# Patient Record
Sex: Female | Born: 1980 | Race: White | Hispanic: No | Marital: Married | Smoking: Never smoker
Health system: Southern US, Community
[De-identification: ages and names within clinical notes are randomized; demographics above are authoritative.]

## PROBLEM LIST (undated history)

## (undated) DIAGNOSIS — B2790 Infectious mononucleosis, unspecified without complication: Secondary | ICD-10-CM

## (undated) DIAGNOSIS — R739 Hyperglycemia, unspecified: Secondary | ICD-10-CM

## (undated) DIAGNOSIS — D649 Anemia, unspecified: Secondary | ICD-10-CM

## (undated) DIAGNOSIS — I7300 Raynaud's syndrome without gangrene: Secondary | ICD-10-CM

## (undated) DIAGNOSIS — E039 Hypothyroidism, unspecified: Secondary | ICD-10-CM

## (undated) DIAGNOSIS — R112 Nausea with vomiting, unspecified: Secondary | ICD-10-CM

## (undated) DIAGNOSIS — Z87442 Personal history of urinary calculi: Secondary | ICD-10-CM

## (undated) DIAGNOSIS — R748 Abnormal levels of other serum enzymes: Secondary | ICD-10-CM

## (undated) DIAGNOSIS — Z9889 Other specified postprocedural states: Secondary | ICD-10-CM

## (undated) HISTORY — PX: ABDOMINAL HYSTERECTOMY: SHX81

## (undated) HISTORY — PX: TOE SURGERY: SHX1073

## (undated) HISTORY — PX: TUBAL LIGATION: SHX77

## (undated) HISTORY — PX: NO PAST SURGERIES: SHX2092

---

## 2012-07-29 ENCOUNTER — Other Ambulatory Visit (HOSPITAL_COMMUNITY): Payer: Self-pay | Admitting: Cardiology

## 2012-07-29 DIAGNOSIS — R002 Palpitations: Secondary | ICD-10-CM

## 2012-08-16 ENCOUNTER — Ambulatory Visit (HOSPITAL_COMMUNITY)
Admission: RE | Admit: 2012-08-16 | Discharge: 2012-08-16 | Disposition: A | Discharge status: Home / Self Care | Payer: 59 | Source: Ambulatory Visit | Attending: Internal Medicine | Admitting: Internal Medicine

## 2012-08-16 DIAGNOSIS — R002 Palpitations: Secondary | ICD-10-CM

## 2012-08-16 NOTE — Progress Notes (Signed)
[facility]   2D echo completed 08/16/2012.   Alexandra Strickland, RDCS

## 2014-08-18 ENCOUNTER — Observation Stay (HOSPITAL_COMMUNITY)
Admission: EM | Admit: 2014-08-18 | Discharge: 2014-08-20 | Disposition: A | Discharge status: Home / Self Care | Payer: BLUE CROSS/BLUE SHIELD | Attending: Internal Medicine | Admitting: Internal Medicine

## 2014-08-18 ENCOUNTER — Emergency Department (HOSPITAL_COMMUNITY): Payer: BLUE CROSS/BLUE SHIELD

## 2014-08-18 ENCOUNTER — Encounter (HOSPITAL_COMMUNITY): Payer: Self-pay

## 2014-08-18 DIAGNOSIS — Z882 Allergy status to sulfonamides status: Secondary | ICD-10-CM | POA: Diagnosis not present

## 2014-08-18 DIAGNOSIS — Q8740 Marfan's syndrome, unspecified: Secondary | ICD-10-CM

## 2014-08-18 DIAGNOSIS — F1099 Alcohol use, unspecified with unspecified alcohol-induced disorder: Secondary | ICD-10-CM | POA: Insufficient documentation

## 2014-08-18 DIAGNOSIS — R7989 Other specified abnormal findings of blood chemistry: Secondary | ICD-10-CM | POA: Diagnosis present

## 2014-08-18 DIAGNOSIS — R55 Syncope and collapse: Principal | ICD-10-CM | POA: Diagnosis present

## 2014-08-18 DIAGNOSIS — E870 Hyperosmolality and hypernatremia: Secondary | ICD-10-CM | POA: Insufficient documentation

## 2014-08-18 DIAGNOSIS — Z888 Allergy status to other drugs, medicaments and biological substances status: Secondary | ICD-10-CM | POA: Diagnosis not present

## 2014-08-18 DIAGNOSIS — N179 Acute kidney failure, unspecified: Secondary | ICD-10-CM | POA: Diagnosis not present

## 2014-08-18 DIAGNOSIS — E785 Hyperlipidemia, unspecified: Secondary | ICD-10-CM | POA: Diagnosis not present

## 2014-08-18 DIAGNOSIS — Z9851 Tubal ligation status: Secondary | ICD-10-CM | POA: Diagnosis not present

## 2014-08-18 DIAGNOSIS — R945 Abnormal results of liver function studies: Secondary | ICD-10-CM

## 2014-08-18 HISTORY — DX: Hyperglycemia, unspecified: R73.9

## 2014-08-18 LAB — CBC WITH DIFFERENTIAL/PLATELET
BASOS ABS: 0 10*3/uL (ref 0.0–0.1)
BASOS PCT: 1 % (ref 0–1)
Eosinophils Absolute: 0.1 10*3/uL (ref 0.0–0.7)
Eosinophils Relative: 2 % (ref 0–5)
HEMATOCRIT: 37.7 % (ref 36.0–46.0)
HEMOGLOBIN: 12.6 g/dL (ref 12.0–15.0)
LYMPHS ABS: 1.1 10*3/uL (ref 0.7–4.0)
Lymphocytes Relative: 24 % (ref 12–46)
MCH: 30.4 pg (ref 26.0–34.0)
MCHC: 33.4 g/dL (ref 30.0–36.0)
MCV: 90.8 fL (ref 78.0–100.0)
MONO ABS: 0.2 10*3/uL (ref 0.1–1.0)
MONOS PCT: 3 % (ref 3–12)
Neutro Abs: 3.2 10*3/uL (ref 1.7–7.7)
Neutrophils Relative %: 70 % (ref 43–77)
PLATELETS: 159 10*3/uL (ref 150–400)
RBC: 4.15 MIL/uL (ref 3.87–5.11)
RDW: 15.6 % — ABNORMAL HIGH (ref 11.5–15.5)
WBC: 4.5 10*3/uL (ref 4.0–10.5)

## 2014-08-18 LAB — URINALYSIS, ROUTINE W REFLEX MICROSCOPIC
Bilirubin Urine: NEGATIVE
GLUCOSE, UA: NEGATIVE mg/dL
Hgb urine dipstick: NEGATIVE
Ketones, ur: NEGATIVE mg/dL
Leukocytes, UA: NEGATIVE
Nitrite: NEGATIVE
PH: 6 (ref 5.0–8.0)
PROTEIN: NEGATIVE mg/dL
SPECIFIC GRAVITY, URINE: 1.012 (ref 1.005–1.030)
Urobilinogen, UA: 0.2 mg/dL (ref 0.0–1.0)

## 2014-08-18 LAB — I-STAT CHEM 8, ED
BUN: 16 mg/dL (ref 6–23)
CALCIUM ION: 1.11 mmol/L — AB (ref 1.12–1.23)
CREATININE: 1.2 mg/dL — AB (ref 0.50–1.10)
Chloride: 111 mmol/L (ref 96–112)
Glucose, Bld: 90 mg/dL (ref 70–99)
HCT: 39 % (ref 36.0–46.0)
Hemoglobin: 13.3 g/dL (ref 12.0–15.0)
POTASSIUM: 4 mmol/L (ref 3.5–5.1)
SODIUM: 146 mmol/L — AB (ref 135–145)
TCO2: 18 mmol/L (ref 0–100)

## 2014-08-18 LAB — POCT PREGNANCY, URINE: PREG TEST UR: NEGATIVE

## 2014-08-18 LAB — CK: CK TOTAL: 278 U/L — AB (ref 7–177)

## 2014-08-18 MED ORDER — SODIUM CHLORIDE 0.9 % IV BOLUS (SEPSIS)
2000.0000 mL | Freq: Once | INTRAVENOUS | Status: AC
Start: 2014-08-18 — End: 2014-08-18
  Administered 2014-08-18: 2000 mL via INTRAVENOUS

## 2014-08-18 MED ORDER — SODIUM CHLORIDE 0.45 % IV SOLN
INTRAVENOUS | Status: DC
  Administered 2014-08-18: 22:00:00 via INTRAVENOUS

## 2014-08-18 MED ORDER — ACETAMINOPHEN 500 MG PO TABS
1000.0000 mg | ORAL_TABLET | Freq: Four times a day (QID) | ORAL | Status: DC | PRN
  Administered 2014-08-18: 1000 mg via ORAL
  Filled 2014-08-18: qty 2

## 2014-08-18 MED ORDER — ONDANSETRON HCL 4 MG/2ML IJ SOLN
4.0000 mg | Freq: Once | INTRAMUSCULAR | Status: AC
  Administered 2014-08-18: 4 mg via INTRAVENOUS
  Filled 2014-08-18: qty 2

## 2014-08-18 MED ORDER — METOCLOPRAMIDE HCL 5 MG/ML IJ SOLN
5.0000 mg | Freq: Once | INTRAMUSCULAR | Status: DC
  Filled 2014-08-18: qty 1

## 2014-08-18 MED ORDER — ENOXAPARIN SODIUM 40 MG/0.4ML ~~LOC~~ SOLN
40.0000 mg | Freq: Every day | SUBCUTANEOUS | Status: DC
  Administered 2014-08-18: 40 mg via SUBCUTANEOUS
  Filled 2014-08-18 (×2): qty 0.4

## 2014-08-18 MED ORDER — ONDANSETRON HCL 4 MG/2ML IJ SOLN
4.0000 mg | Freq: Four times a day (QID) | INTRAMUSCULAR | Status: DC | PRN
  Administered 2014-08-18: 4 mg via INTRAVENOUS
  Filled 2014-08-18: qty 2

## 2014-08-18 MED ORDER — SODIUM CHLORIDE 0.9 % IJ SOLN
3.0000 mL | Freq: Two times a day (BID) | INTRAMUSCULAR | Status: DC
  Administered 2014-08-19: 3 mL via INTRAVENOUS

## 2014-08-18 NOTE — ED Provider Notes (Signed)
CSN: [id number]     Arrival date & time 08/18/14  1630 History   First MD Initiated Contact with Patient 08/18/14 1635     Chief Complaint  Patient presents with  . Loss of Consciousness    Level V caveat altered mental status. History is obtained from patient, from patient's husband and from paramedics (Consider location/radiation/quality/duration/timing/severity/associated sxs/prior Treatment) HPI Patient was running in a 10K race earlier today when she collapsed while running and was confused, and combative immediately after the event.. Presently she denies pain anywhere states "I feel sad". EMS obtained cbg which was 163 and treated patient with intravenous fluids.  No past medical history on file. No past surgical history on file. past medical history negative No family history on file. History  Substance Use Topics  . Smoking status: Not on file  . Smokeless tobacco: Not on file  . Alcohol Use: Not on file   no tobacco no alcohol no drug OB History    No data available     Review of Systems  Unable to perform ROS  unable to obtain review of systems. Altered mental status    Allergies  Review of patient's allergies indicates not on file.  Home Medications   Prior to Admission medications   Not on File   BP 101/72 mmHg  Pulse 108  Temp(Src) 100.7 F (38.2 C) (Rectal)  Resp 17  SpO2 98% Physical Exam  Constitutional: She appears well-developed and well-nourished. She appears distressed.  HENT:  Head: Normocephalic and atraumatic.  Eyes: Conjunctivae are normal. Pupils are equal, round, and reactive to light.  Neck: Neck supple. No JVD present. No tracheal deviation present. No thyromegaly present.  Cardiovascular: Regular rhythm.   No murmur heard. Mildly tachycardic  Pulmonary/Chest: Effort normal and breath sounds normal.  Abdominal: Soft. Bowel sounds are normal. She exhibits no distension. There is no tenderness.  Musculoskeletal: Normal range of motion.  She exhibits no edema or tenderness.  Neurological: She is alert. No cranial nerve deficit. Coordination normal.  Moves all extremities follows simple commands. DTRs symmetric bilaterally at knee jerk ankle jerk and biceps toes downward going bilaterally. Does not answer all questions.  Skin: No rash noted.  grossly diaphoretic  Psychiatric: She has a normal mood and affect.  Nursing note and vitals reviewed.   ED Course  Procedures (including critical care time) Labs Review Labs Reviewed  CBC WITH DIFFERENTIAL/PLATELET  CK  URINALYSIS, ROUTINE W REFLEX MICROSCOPIC  I-STAT CHEM 8, ED  POC URINE PREG, ED    Imaging Review No results found.   EKG Interpretation   Date/Time:  Saturday August 18 2014 16:51:53 EDT Ventricular Rate:  99 PR Interval:  151 QRS Duration: 87 QT Interval:  365 QTC Calculation: 468 R Axis:   69 Text Interpretation:  Sinus rhythm No significant change since last  tracing Confirmed by Saket Hellstrom  MD, Jann Ra [phone]) on 08/18/2014 4:55:14 PM     7 PM patient feels improved however she feels continues to feel nauseated after treatment with intravenous fluids and intravenous Zofran. Additional Zofran ordered. She is alert appropriate Glasgow Coma Score 15.  Have an 50 5 PM patient is alert Glasgow Coma Score 15 continues to complain of generalized weakness. Nausea is improved after treatment with antiemetics. Results for orders placed or performed during the hospital encounter of 08/18/14  CBC with Differential/Platelet  Result Value Ref Range   WBC 4.5 4.0 - 10.5 K/uL   RBC 4.15 3.87 - 5.11 MIL/uL   Hemoglobin  12.6 12.0 - 15.0 g/dL   HCT 16.1 09.6 - 04.5 %   MCV 90.8 78.0 - 100.0 fL   MCH 30.4 26.0 - 34.0 pg   MCHC 33.4 30.0 - 36.0 g/dL   RDW 40.9 (H) 81.1 - 91.4 %   Platelets 159 150 - 400 K/uL   Neutrophils Relative % 70 43 - 77 %   Neutro Abs 3.2 1.7 - 7.7 K/uL   Lymphocytes Relative 24 12 - 46 %   Lymphs Abs 1.1 0.7 - 4.0 K/uL   Monocytes  Relative 3 3 - 12 %   Monocytes Absolute 0.2 0.1 - 1.0 K/uL   Eosinophils Relative 2 0 - 5 %   Eosinophils Absolute 0.1 0.0 - 0.7 K/uL   Basophils Relative 1 0 - 1 %   Basophils Absolute 0.0 0.0 - 0.1 K/uL  CK  Result Value Ref Range   Total CK 278 (H) 7 - 177 U/L  I-stat chem 8, ed  Result Value Ref Range   Sodium 146 (H) 135 - 145 mmol/L   Potassium 4.0 3.5 - 5.1 mmol/L   Chloride 111 96 - 112 mmol/L   BUN 16 6 - 23 mg/dL   Creatinine, Ser 7.82 (H) 0.50 - 1.10 mg/dL   Glucose, Bld 90 70 - 99 mg/dL   Calcium, Ion 9.56 (L) 1.12 - 1.23 mmol/L   TCO2 18 0 - 100 mmol/L   Hemoglobin 13.3 12.0 - 15.0 g/dL   HCT 21.3 08.6 - 57.8 %   Ct Head Wo Contrast  08/18/2014   CLINICAL DATA:  Syncope.  Nausea.  Headache.  Altered mental status.  EXAM: CT HEAD WITHOUT CONTRAST  TECHNIQUE: Contiguous axial images were obtained from the base of the skull through the vertex without intravenous contrast.  COMPARISON:  None.  FINDINGS: No evidence of intracranial hemorrhage, brain edema, or other signs of acute infarction. No evidence of intracranial mass lesion or mass effect.  No abnormal extraaxial fluid collections identified. Ventricles are normal in size. No skull abnormality identified.  IMPRESSION: Negative noncontrast head CT.   Electronically Signed   By: Myles Rosenthal M.D.   On: 08/18/2014 18:26    MDM  Suspect mild heat stroke given elevated temperature, confusion, syncope. Symptoms improving with intravenous fluids and antiemetics. Final diagnoses:  None   Spoke with Dr. Selena Batten Plan telemetry, observation, Dx #1 exertional syncope #2 altered mental status     Doug Sou, MD 08/18/14 1956

## 2014-08-18 NOTE — ED Notes (Signed)
Pt given PO fluids with verbal order from EDP.

## 2014-08-18 NOTE — ED Notes (Signed)
Transporting patient to new room assignment. 

## 2014-08-18 NOTE — ED Notes (Signed)
EDP at bedside  

## 2014-08-18 NOTE — ED Notes (Addendum)
[facility]- pt coming from a 10K, was found after syncopal episode on the side of the road. On EMS arrival, pt noted to have HR of 160, JVD noted, unresponsive. CBG of 161, 216g PIV in place. Pt received 1500cc en route.

## 2014-08-18 NOTE — H&P (Signed)
Alexandra CossKristen Strickland is an 34 y.o. female.    Chief Complaint: syncope HPI: 34 yo female was running in a race and doesn't recall what happened.  There is question of syncope about 5.5 miles into the race. Pt is not sure how long she was out.  No seizure.  Pt denies cp, palp, sob.   + n/v.  No bloody emesis.  Pt denies focal weakness, numbness, tingling.  There was some element of confusion.  Per family  sbp 80.  Pt will be admitted for syncope.   Past Medical History  Diagnosis Date  . Hyperlipemia   . Hyperglycemia     Past Surgical History  Procedure Laterality Date  . Toe surgery    . Tubal ligation      Family History  Problem Relation Age of Onset  . Cancer Father    Social History:  reports that she has never smoked. She does not have any smokeless tobacco history on file. She reports that she does not drink alcohol. Her drug history is not on file.  Allergies:  Allergies  Allergen Reactions  . Sulfa Antibiotics Hives   Medications none  (Not in a hospital admission)  Results for orders placed or performed during the hospital encounter of 08/18/14 (from the past 48 hour(s))  CBC with Differential/Platelet     Status: Abnormal   Collection Time: 08/18/14  4:51 PM  Result Value Ref Range   WBC 4.5 4.0 - 10.5 K/uL   RBC 4.15 3.87 - 5.11 MIL/uL   Hemoglobin 12.6 12.0 - 15.0 g/dL   HCT 16.137.7 09.636.0 - 04.546.0 %   MCV 90.8 78.0 - 100.0 fL   MCH 30.4 26.0 - 34.0 pg   MCHC 33.4 30.0 - 36.0 g/dL   RDW 40.915.6 (H) 81.111.5 - 91.415.5 %   Platelets 159 150 - 400 K/uL   Neutrophils Relative % 70 43 - 77 %   Neutro Abs 3.2 1.7 - 7.7 K/uL   Lymphocytes Relative 24 12 - 46 %   Lymphs Abs 1.1 0.7 - 4.0 K/uL   Monocytes Relative 3 3 - 12 %   Monocytes Absolute 0.2 0.1 - 1.0 K/uL   Eosinophils Relative 2 0 - 5 %   Eosinophils Absolute 0.1 0.0 - 0.7 K/uL   Basophils Relative 1 0 - 1 %   Basophils Absolute 0.0 0.0 - 0.1 K/uL  CK     Status: Abnormal   Collection Time: 08/18/14  4:51 PM  Result  Value Ref Range   Total CK 278 (H) 7 - 177 U/L  I-stat chem 8, ed     Status: Abnormal   Collection Time: 08/18/14  5:08 PM  Result Value Ref Range   Sodium 146 (H) 135 - 145 mmol/L   Potassium 4.0 3.5 - 5.1 mmol/L   Chloride 111 96 - 112 mmol/L   BUN 16 6 - 23 mg/dL   Creatinine, Ser 7.821.20 (H) 0.50 - 1.10 mg/dL   Glucose, Bld 90 70 - 99 mg/dL   Calcium, Ion 9.561.11 (L) 1.12 - 1.23 mmol/L   TCO2 18 0 - 100 mmol/L   Hemoglobin 13.3 12.0 - 15.0 g/dL   HCT 21.339.0 08.636.0 - 57.846.0 %   Ct Head Wo Contrast  08/18/2014   CLINICAL DATA:  Syncope.  Nausea.  Headache.  Altered mental status.  EXAM: CT HEAD WITHOUT CONTRAST  TECHNIQUE: Contiguous axial images were obtained from the base of the skull through the vertex without intravenous contrast.  COMPARISON:  None.  FINDINGS: No evidence of intracranial hemorrhage, brain edema, or other signs of acute infarction. No evidence of intracranial mass lesion or mass effect.  No abnormal extraaxial fluid collections identified. Ventricles are normal in size. No skull abnormality identified.  IMPRESSION: Negative noncontrast head CT.   Electronically Signed   By: Myles Rosenthal M.D.   On: 08/18/2014 18:26    Review of Systems  Constitutional: Negative.   HENT: Negative.   Eyes: Negative.   Respiratory: Negative.   Cardiovascular: Negative.   Gastrointestinal: Positive for nausea and vomiting.  Genitourinary: Negative.   Skin: Negative.   Neurological: Positive for loss of consciousness. Negative for dizziness, tingling, tremors, sensory change, speech change, focal weakness and seizures.  Endo/Heme/Allergies: Negative.   Psychiatric/Behavioral: Negative.     Blood pressure 104/62, pulse 89, temperature 100.7 F (38.2 C), temperature source Rectal, resp. rate 17, last menstrual period 08/04/2014, SpO2 100 %. Physical Exam  Constitutional: She is oriented to person, place, and time. She appears well-developed and well-nourished.  HENT:  Head: Normocephalic and  atraumatic.  Eyes: Conjunctivae and EOM are normal. Pupils are equal, round, and reactive to light. No scleral icterus.  Neck: Normal range of motion. Neck supple. No JVD present. No tracheal deviation present. No thyromegaly present.  Cardiovascular: Normal rate and regular rhythm.  Exam reveals no gallop and no friction rub.   No murmur heard. Respiratory: Effort normal and breath sounds normal. No respiratory distress. She has no wheezes. She has no rales.  GI: Soft. Bowel sounds are normal. She exhibits no distension. There is no tenderness. There is no rebound and no guarding.  Musculoskeletal: Normal range of motion. She exhibits no edema or tenderness.  Lymphadenopathy:    She has no cervical adenopathy.  Neurological: She is alert and oriented to person, place, and time. She has normal reflexes. She displays normal reflexes. No cranial nerve deficit. She exhibits normal muscle tone. Coordination normal.  Skin: Skin is warm and dry. No rash noted. No erythema. No pallor.  Psychiatric: She has a normal mood and affect. Her behavior is normal. Judgment and thought content normal.     Assessment/Plan Syncope  Tele Trop i q6h x3 D dimer If positive will consider CTA chest Check carotid u/s, cardiac echo  Hypernatremia Check cmp in am Hydrate with 1/2 ns  Renal insufficiency Hydrate with 1/2 ns  DVT prophylaxis scd, lovenox   Alexandra Strickland 08/18/2014, 8:03 PM

## 2014-08-19 ENCOUNTER — Observation Stay (HOSPITAL_COMMUNITY): Payer: BLUE CROSS/BLUE SHIELD

## 2014-08-19 ENCOUNTER — Encounter (HOSPITAL_COMMUNITY): Payer: Self-pay | Admitting: Radiology

## 2014-08-19 DIAGNOSIS — R748 Abnormal levels of other serum enzymes: Secondary | ICD-10-CM

## 2014-08-19 DIAGNOSIS — E785 Hyperlipidemia, unspecified: Secondary | ICD-10-CM | POA: Diagnosis not present

## 2014-08-19 DIAGNOSIS — R7989 Other specified abnormal findings of blood chemistry: Secondary | ICD-10-CM | POA: Diagnosis not present

## 2014-08-19 DIAGNOSIS — R55 Syncope and collapse: Secondary | ICD-10-CM

## 2014-08-19 DIAGNOSIS — E870 Hyperosmolality and hypernatremia: Secondary | ICD-10-CM | POA: Diagnosis not present

## 2014-08-19 DIAGNOSIS — N179 Acute kidney failure, unspecified: Secondary | ICD-10-CM | POA: Diagnosis not present

## 2014-08-19 LAB — COMPREHENSIVE METABOLIC PANEL
ALT: 155 U/L — ABNORMAL HIGH (ref 0–35)
AST: 156 U/L — ABNORMAL HIGH (ref 0–37)
Albumin: 3.4 g/dL — ABNORMAL LOW (ref 3.5–5.2)
Alkaline Phosphatase: 69 U/L (ref 39–117)
Anion gap: 6 (ref 5–15)
BUN: 10 mg/dL (ref 6–23)
CO2: 22 mmol/L (ref 19–32)
Calcium: 8.5 mg/dL (ref 8.4–10.5)
Chloride: 109 mmol/L (ref 96–112)
Creatinine, Ser: 0.97 mg/dL (ref 0.50–1.10)
GFR calc Af Amer: 87 mL/min — ABNORMAL LOW (ref >90)
GFR calc non Af Amer: 75 mL/min — ABNORMAL LOW (ref >90)
Glucose, Bld: 103 mg/dL — ABNORMAL HIGH (ref 70–99)
Potassium: 3.5 mmol/L (ref 3.5–5.1)
Sodium: 137 mmol/L (ref 135–145)
Total Bilirubin: 0.9 mg/dL (ref 0.3–1.2)
Total Protein: 6.1 g/dL (ref 6.0–8.3)

## 2014-08-19 LAB — LIPID PANEL
CHOL/HDL RATIO: 2.3 ratio
CHOLESTEROL: 172 mg/dL (ref 0–200)
HDL: 74 mg/dL (ref >39)
LDL Cholesterol: 94 mg/dL (ref 0–99)
TRIGLYCERIDES: 19 mg/dL (ref <150)
VLDL: 4 mg/dL (ref 0–40)

## 2014-08-19 LAB — CK TOTAL AND CKMB (NOT AT ARMC)
CK TOTAL: 430 U/L — AB (ref 7–177)
CK TOTAL: 376 U/L — AB (ref 7–177)
CK, MB: 7.5 ng/mL — AB (ref 0.3–4.0)
CK, MB: 5.6 ng/mL — AB (ref 0.3–4.0)
RELATIVE INDEX: 1.7 (ref 0.0–2.5)
Relative Index: 1.5 (ref 0.0–2.5)

## 2014-08-19 LAB — TROPONIN I
TROPONIN I: 0.24 ng/mL — AB (ref <0.031)
TROPONIN I: 0.07 ng/mL — AB (ref <0.031)
Troponin I: 0.09 ng/mL — ABNORMAL HIGH (ref <0.031)

## 2014-08-19 LAB — TSH: TSH: 2.881 u[IU]/mL (ref 0.350–4.500)

## 2014-08-19 LAB — HEPARIN LEVEL (UNFRACTIONATED): HEPARIN UNFRACTIONATED: 0.3 [IU]/mL (ref 0.30–0.70)

## 2014-08-19 LAB — D-DIMER, QUANTITATIVE: D-Dimer, Quant: 0.98 ug/mL-FEU — ABNORMAL HIGH (ref 0.00–0.48)

## 2014-08-19 MED ORDER — GUAIFENESIN ER 600 MG PO TB12
600.0000 mg | ORAL_TABLET | Freq: Two times a day (BID) | ORAL | Status: DC | PRN
  Filled 2014-08-19: qty 1

## 2014-08-19 MED ORDER — HEPARIN BOLUS VIA INFUSION
4000.0000 [IU] | Freq: Once | INTRAVENOUS | Status: AC
  Administered 2014-08-19: 4000 [IU] via INTRAVENOUS
  Filled 2014-08-19: qty 4000

## 2014-08-19 MED ORDER — HEPARIN (PORCINE) IN NACL 100-0.45 UNIT/ML-% IJ SOLN
900.0000 [IU]/h | INTRAMUSCULAR | Status: DC
Start: 2014-08-19 — End: 2014-08-19
  Administered 2014-08-19: 900 [IU]/h via INTRAVENOUS
  Filled 2014-08-19: qty 250

## 2014-08-19 MED ORDER — IOHEXOL 350 MG/ML SOLN
80.0000 mL | Freq: Once | INTRAVENOUS | Status: AC | PRN
  Administered 2014-08-19: 80 mL via INTRAVENOUS

## 2014-08-19 MED ORDER — POTASSIUM CHLORIDE CRYS ER 20 MEQ PO TBCR
40.0000 meq | EXTENDED_RELEASE_TABLET | Freq: Once | ORAL | Status: AC
Start: 2014-08-19 — End: 2014-08-19
  Administered 2014-08-19: 40 meq via ORAL
  Filled 2014-08-19: qty 2

## 2014-08-19 MED ORDER — SODIUM CHLORIDE 0.9 % IV SOLN
INTRAVENOUS | Status: AC
  Administered 2014-08-19: 75 mL/h via INTRAVENOUS

## 2014-08-19 NOTE — Progress Notes (Signed)
[facility] progress note. Chief complaint. Abnormal labs. History of present illness. This 34 year old female was recently admitted after experiencing syncope during a foot race event. Workup includes a d-dimer and troponin. D-dimer has resulted elevated 0.98. First troponin resulted elevated 0.24. 12-lead EKG was repeated and this shows sinus bradycardia without indication of acute ischemia. Came to see the patient at bedside and she denies chest pain or dyspnea. She does complain of a sensation of heaviness on her chest however. Physical exam. Vital signs. Temperature 98.4, pulse 73, respiration 18, blood pressure 111/75. O2 sats 100%. General appearance. Well-developed middle-aged female who is alert and in no distress. Cardiac. Rate and rhythm regular. Lungs. Breath sounds clear and equal. Abdomen. Soft with positive bowel sounds. Impression/plan. Problem #1. Elevated d-dimer. Patient with normal renal function. We'll send for ACTH gram of the chest to rule out pulmonary emboli. Problem #2. Elevated troponin. No complaints of chest pain though does note some chest heaviness. Current EKG does not look acutely ischemic. I will contact cardiology and request a consult. Follow for cardiology recommendations.

## 2014-08-19 NOTE — Progress Notes (Signed)
PROGRESS NOTE  Alexandra Strickland ZOX:[id number] DOB: 1980-06-02 DOA: 08/18/2014 PCP: Haze RushingOBB, DEBORAH, MD  HPI/Recap of past 24 hours:  Feeling better, no chest pain , no n/v. Husband at bedside.  Assessment/Plan: Active Problems:   Syncope  Syncope: will check orthostatic vital signs. Continue tele, cycle cardiac enzymes, echo pending. CTA no PE, d/c heparin drip. Ct head no acute findings/carotid us pending. Continue hydration. Continue bed rest, start scd's.  Elevation of lft, unclear etiology, check ab us, acute hepatitis panel for now.  Arf; likely secondary to dehydration, improved with hydration, ua concentrated, otherwise unremarkable.    Code Status: full  Family Communication: patient and family  Disposition Plan: remain inpatient   Consultants:  none  Procedures:  none  Antibiotics:  none   Objective: BP 109/73 mmHg  Pulse 62  Temp(Src) 98.3 F (36.8 C) (Oral)  Resp 18  Ht 5\' 10"  (1.778 m)  Wt 76.7 kg (169 lb 1.5 oz)  BMI 24.26 kg/m2  SpO2 100%  LMP 08/04/2014  Intake/Output Summary (Last 24 hours) at 08/19/14 1559 Last data filed at 08/19/14 0919  Gross per 24 hour  Intake    476 ml  Output    800 ml  Net   -324 ml   Filed Weights   08/19/14 0602  Weight: 76.7 kg (169 lb 1.5 oz)    Exam:   General:  NAD  Cardiovascular: RRR  Respiratory: CTABL  Abdomen: Soft/ND/NT, positive BS  Musculoskeletal: No Edema  Neuro: aaox3  Data Reviewed: Basic Metabolic Panel:  Recent Labs Lab 08/18/14 1708 08/19/14 0840  NA 146* 137  K 4.0 3.5  CL 111 109  CO2  --  22  GLUCOSE 90 103*  BUN 16 10  CREATININE 1.20* 0.97  CALCIUM  --  8.5   Liver Function Tests:  Recent Labs Lab 08/19/14 0840  AST 156*  ALT 155*  ALKPHOS 69  BILITOT 0.9  PROT 6.1  ALBUMIN 3.4*   No results for input(s): LIPASE, AMYLASE in the last 168 hours. No results for input(s): AMMONIA in the last 168 hours. CBC:  Recent Labs Lab 08/18/14 1651  08/18/14 1708  WBC 4.5  --   NEUTROABS 3.2  --   HGB 12.6 13.3  HCT 37.7 39.0  MCV 90.8  --   PLT 159  --    Cardiac Enzymes:    Recent Labs Lab 08/18/14 1651 08/19/14 0255  CKTOTAL 278*  --   TROPONINI  --  0.24*   BNP (last 3 results) No results for input(s): BNP in the last 8760 hours.  ProBNP (last 3 results) No results for input(s): PROBNP in the last 8760 hours.  CBG: No results for input(s): GLUCAP in the last 168 hours.  No results found for this or any previous visit (from the past 240 hour(s)).   Studies: Ct Head Wo Contrast  08/18/2014   CLINICAL DATA:  Syncope.  Nausea.  Headache.  Altered mental status.  EXAM: CT HEAD WITHOUT CONTRAST  TECHNIQUE: Contiguous axial images were obtained from the base of the skull through the vertex without intravenous contrast.  COMPARISON:  None.  FINDINGS: No evidence of intracranial hemorrhage, brain edema, or other signs of acute infarction. No evidence of intracranial mass lesion or mass effect.  No abnormal extraaxial fluid collections identified. Ventricles are normal in size. No skull abnormality identified.  IMPRESSION: Negative noncontrast head CT.   Electronically Signed   By: Myles RosenthalJohn  Stahl M.D.   On: 08/18/2014 18:26  Scheduled Meds: . potassium chloride  40 mEq Oral Once  . sodium chloride  3 mL Intravenous Q12H    Continuous Infusions: . sodium chloride       Time spent: >9mins  Judee Hennick MD, PhD  [facility] Pager [phone]. If 7PM-7AM, please contact night-coverage at [email or web address], password [facility] 08/19/2014, 3:59 PM

## 2014-08-19 NOTE — Consult Note (Signed)
CARDIOLOGY CONSULT NOTE  Patient ID: Alexandra Strickland MRN: [id number] DOB/AGE: 35-Aug-1982 35 y.o.  Admit date: 08/18/2014 Primary Physician Haze Rushing, MD Primary Cardiologist None Chief Complaint   Syncope  HPI:  The patient presents following an episode of syncope.  She has no prior cardiac history although she has been screened for Marfan's.  Her brother has has AVR/root replacement.  She is an avid runner and routinely does 10 mile runs.   She was running and at 5.7 miles of a 10 k race yesterday.  I don't have any eye witnesses to interview. She says she was hot but not unduly so.  She said that she was slightly off her face but otherwise not feeling well. She doesn't recall the event at all. She did not have any injury but was said ease herself down though again I have no outside account of this. She tells me she was out for 25 minutes. She did not realize where she wasn't she was in the ambulance. There was some mention in the chart her having an SBP of 80.   She does have a previous echo with a normal EF and no aortic root dilatation was done a couple of years ago to screen for any Marfan's changes. She's otherwise never been told she had this diagnosis but she's not had any genetic testing family or other testing.   In the ED demonstrated no PE on CT scan.  Troponin I was elevated at 0.24.  There has been no follow-up yet. EKG demonstrated no abnormalities. She otherwise has done quite well. She denies any chest pressure, neck or arm discomfort. She's never had palpitations, presyncope or syncope. She has no PND or orthopnea. She has no shortness of breath.  Past Medical History  Diagnosis Date  . Hyperlipemia   . Hyperglycemia     Past Surgical History  Procedure Laterality Date  . Toe surgery    . Tubal ligation      Allergies  Allergen Reactions  . Phenergan [Promethazine Hcl]     Nausea   . Sulfa Antibiotics Hives   Prescriptions prior to admission  Medication Sig  Dispense Refill Last Dose  . acetaminophen (TYLENOL) 500 MG tablet Take 1,000 mg by mouth every 6 (six) hours as needed for headache.   08/18/2014 at Unknown time   Family History  Problem Relation Age of Onset  . Cancer Father     History   Social History  . Marital Status: Married    Spouse Name: N/A  . Number of Children: N/A  . Years of Education: N/A   Occupational History  . Not on file.   Social History Main Topics  . Smoking status: Never Smoker   . Smokeless tobacco: Not on file  . Alcohol Use: No  . Drug Use: Not on file  . Sexual Activity: Not on file   Other Topics Concern  . Not on file   Social History Narrative     ROS:    As stated in the HPI and negative for all other systems.  Physical Exam: Blood pressure 109/73, pulse 62, temperature 98.3 F (36.8 C), temperature source Oral, resp. rate 18, height  (1.778 m), weight 169 lb 1.5 oz (76.7 kg), last menstrual period 08/04/2014, SpO2 100 %.  GENERAL:  Well appearing, tall HEENT:  Pupils equal round and reactive, fundi not visualized, oral mucosa unremarkable NECK:  No jugular venous distention, waveform within normal limits, carotid upstroke brisk and symmetric, no  bruits, no thyromegaly LYMPHATICS:  No cervical, inguinal adenopathy LUNGS:  Clear to auscultation bilaterally BACK:  No CVA tenderness CHEST:  Unremarkable HEART:  PMI not displaced or sustained,S1 and S2 within normal limits, no S3, no S4, no clicks, no rubs, no murmurs ABD:  Flat, positive bowel sounds normal in frequency in pitch, no bruits, no rebound, no guarding, no midline pulsatile mass, no hepatomegaly, no splenomegaly EXT:  2 plus pulses throughout, no edema, no cyanosis no clubbing SKIN:  No rashes no nodules NEURO:  Cranial nerves II through XII grossly intact, motor grossly intact throughout PSYCH:  Cognitively intact, oriented to person place and time  Labs: Lab Results  Component Value Date   BUN 10 08/19/2014   Lab  Results  Component Value Date   CREATININE 0.97 08/19/2014   Lab Results  Component Value Date   NA 137 08/19/2014   K 3.5 08/19/2014   CL 109 08/19/2014   CO2 22 08/19/2014   Lab Results  Component Value Date   TROPONINI 0.24* 08/19/2014   Lab Results  Component Value Date   WBC 4.5 08/18/2014   HGB 13.3 08/18/2014   HCT 39.0 08/18/2014   MCV 90.8 08/18/2014   PLT 159 08/18/2014   No results found for: CHOL, HDL, LDLCALC, LDLDIRECT, TRIG, CHOLHDL Lab Results  Component Value Date   ALT 155* 08/19/2014   AST 156* 08/19/2014   ALKPHOS 69 08/19/2014   BILITOT 0.9 08/19/2014    Radiology:   CT: No evidence of pulmonary emboli.   No acute abnormality is seen.  EKG:  Sinus rhythm, rate 54, axis within normal limits, intervals within normal limits, no acute ST-T wave changes.  ASSESSMENT AND PLAN:   SYNCOPE:  This could've been a vagal episode although this would be unusual in the middle of her run. She is going to start with an echocardiogram. To further evaluate for any structural abnormalities I will order an MRI. I will check with our imaging specialists and with radiology. To see if we could also check for dural ectasia at the same time. If this is unrevealing she will need an outpatient POET (Plain Old Exercise Treadmill)  MARFANS:  I suspect this family history is unrelated to the syncope. However, she will be screened as above for size and other. We can talk to her further about family history and whether her brother in particular has had any genetic markers obtained.  TROPONIN:   We need to see a trend.  This is nonspecific in this setting.  The work up is as above.  I do not suspect an actue coronary syndrome.  She has had no chest pain.    SignedRollene Rotunda: Nohemi Nicklaus 08/19/2014, 1:49 PM

## 2014-08-19 NOTE — Progress Notes (Signed)
UR completed 

## 2014-08-19 NOTE — Progress Notes (Signed)
Bilateral carotid artery duplex completed:  1-39% ICA stenosis.  Vertebral artery flow is antegrade.     

## 2014-08-19 NOTE — Progress Notes (Signed)
ANTICOAGULATION CONSULT NOTE - Follow Up Consult  Pharmacy Consult for Heparin Indication: chest pain/ACS  Allergies  Allergen Reactions  . Phenergan [Promethazine Hcl]     Nausea   . Sulfa Antibiotics Hives    Patient Measurements: Height: 5\' 10"  (177.8 cm) Weight: 169 lb 1.5 oz (76.7 kg) IBW/kg (Calculated) : 68.5 Heparin Dosing Weight: 76.7 kg  Vital Signs: Temp: 98.3 F (36.8 C) (04/03 1304) Temp Source: Oral (04/03 1304) BP: 109/73 mmHg (04/03 1304) Pulse Rate: 62 (04/03 1304)  Labs:  Recent Labs  08/18/14 1651 08/18/14 1708 08/19/14 0255 08/19/14 0840 08/19/14 1311  HGB 12.6 13.3  --   --   --   HCT 37.7 39.0  --   --   --   PLT 159  --   --   --   --   HEPARINUNFRC  --   --   --   --  0.30  CREATININE  --  1.20*  --  0.97  --   CKTOTAL 278*  --   --   --   --   TROPONINI  --   --  0.24*  --   --     Estimated Creatinine Clearance: 88.4 mL/min (by C-G formula based on Cr of 0.97).   Assessment: 34yo female with history of HLD presents after syncopal event following a foot race and was found to have elevated D-Dimer and Trop. D/o chest heaviness. CBC wnl, sCr 1.2, Trop 0.24, D-Dimer 0.98. CT negative for PE.  Anticoagulation: Heparin for r/o cardiac ischemia. Heparin level 0.3. No CBC this AM.  Goal of Therapy:  Heparin level 0.3-0.7 units/ml Monitor platelets by anticoagulation protocol: Yes   Plan:  Continue Iv heparin at 900 units/hr Recheck level in 6 hrs to confirm. Daily HL and CBC F/u plan after cardiology consult  Kastin Cerda S. Merilynn Finlandobertson, PharmD, BCPS Clinical Staff Pharmacist Pager [phone]  Misty Stanleyobertson, [employer] Stillinger 08/19/2014,1:46 PM

## 2014-08-19 NOTE — Progress Notes (Addendum)
Patient d-dimer noted to be 0.98 and Troponin 0.24 at 255 am updated in chart at 418. No notification from lab. Will communicate results [facility]. Will monitor accordingly.

## 2014-08-19 NOTE — Progress Notes (Signed)
ANTICOAGULATION CONSULT NOTE - Initial Consult  Pharmacy Consult for Heparin Indication: chest pain/ACS  Allergies  Allergen Reactions  . Phenergan [Promethazine Hcl]     Nausea   . Sulfa Antibiotics Hives    Patient Measurements: Height: 5\' 10"  (177.8 cm) Weight: 169 lb 1.5 oz (76.7 kg) IBW/kg (Calculated) : 68.5 Heparin Dosing Weight: 76.7 kg  Vital Signs: Temp: 97.3 F (36.3 C) (04/03 0602) Temp Source: Oral (04/03 0602) BP: 106/57 mmHg (04/03 0602) Pulse Rate: 60 (04/03 0602)  Labs:  Recent Labs  08/18/14 1651 08/18/14 1708 08/19/14 0255  HGB 12.6 13.3  --   HCT 37.7 39.0  --   PLT 159  --   --   CREATININE  --  1.20*  --   CKTOTAL 278*  --   --   TROPONINI  --   --  0.24*    Estimated Creatinine Clearance: 71.4 mL/min (by C-G formula based on Cr of 1.2).   Medical History: Past Medical History  Diagnosis Date  . Hyperlipemia   . Hyperglycemia     Medications:  Prescriptions prior to admission  Medication Sig Dispense Refill Last Dose  . acetaminophen (TYLENOL) 500 MG tablet Take 1,000 mg by mouth every 6 (six) hours as needed for headache.   08/18/2014 at Unknown time   Scheduled:  . enoxaparin (LOVENOX) injection  40 mg Subcutaneous QHS  . metoCLOPramide (REGLAN) injection  5 mg Intravenous Once  . sodium chloride  3 mL Intravenous Q12H   Infusions:  . sodium chloride 100 mL/hr at 08/18/14 2203    Assessment: 34yo female with history of HLD presents after syncopal event following race and was found to have elevated D-Dimer and Trop. Pharmacy is consulted to dose heparin for ACS/chest pain. CBC wnl, sCr 1.2, Trop 0.24, D-Dimer 0.98.  Pt received lovenox 40mg  around 2330 last night.  Goal of Therapy:  Heparin level 0.3-0.7 units/ml Monitor platelets by anticoagulation protocol: Yes   Plan:  Give 4000 units bolus x 1 Start heparin infusion at 900 units/hr Check anti-Xa level in 6 hours and daily while on heparin Continue to monitor H&H  and platelets  Arlean Hoppingorey M. Newman PiesBall, PharmD Clinical Pharmacist Pager [phone] 08/19/2014,6:13 AM

## 2014-08-20 ENCOUNTER — Observation Stay (HOSPITAL_COMMUNITY): Payer: BLUE CROSS/BLUE SHIELD

## 2014-08-20 ENCOUNTER — Other Ambulatory Visit: Payer: Self-pay | Admitting: Cardiology

## 2014-08-20 DIAGNOSIS — R945 Abnormal results of liver function studies: Secondary | ICD-10-CM

## 2014-08-20 DIAGNOSIS — Q8740 Marfan's syndrome, unspecified: Secondary | ICD-10-CM | POA: Diagnosis not present

## 2014-08-20 DIAGNOSIS — R7989 Other specified abnormal findings of blood chemistry: Secondary | ICD-10-CM | POA: Diagnosis not present

## 2014-08-20 DIAGNOSIS — R55 Syncope and collapse: Secondary | ICD-10-CM | POA: Diagnosis not present

## 2014-08-20 LAB — CK TOTAL AND CKMB (NOT AT ARMC)
CK, MB: 4.7 ng/mL — ABNORMAL HIGH (ref 0.3–4.0)
RELATIVE INDEX: 1.5 (ref 0.0–2.5)
Total CK: 320 U/L — ABNORMAL HIGH (ref 7–177)

## 2014-08-20 LAB — COMPREHENSIVE METABOLIC PANEL
ALBUMIN: 3.2 g/dL — AB (ref 3.5–5.2)
ALT: 380 U/L — ABNORMAL HIGH (ref 0–35)
AST: 273 U/L — ABNORMAL HIGH (ref 0–37)
Alkaline Phosphatase: 73 U/L (ref 39–117)
Anion gap: 5 (ref 5–15)
BUN: 8 mg/dL (ref 6–23)
CO2: 25 mmol/L (ref 19–32)
CREATININE: 0.77 mg/dL (ref 0.50–1.10)
Calcium: 8.7 mg/dL (ref 8.4–10.5)
Chloride: 108 mmol/L (ref 96–112)
Glucose, Bld: 115 mg/dL — ABNORMAL HIGH (ref 70–99)
Potassium: 4.1 mmol/L (ref 3.5–5.1)
Sodium: 138 mmol/L (ref 135–145)
Total Bilirubin: 0.7 mg/dL (ref 0.3–1.2)
Total Protein: 5.3 g/dL — ABNORMAL LOW (ref 6.0–8.3)

## 2014-08-20 LAB — TROPONIN I: Troponin I: 0.05 ng/mL — ABNORMAL HIGH (ref <0.031)

## 2014-08-20 LAB — HEPATITIS PANEL, ACUTE
HCV Ab: NEGATIVE
HEP A IGM: NONREACTIVE
Hep B C IgM: NONREACTIVE
Hepatitis B Surface Ag: NEGATIVE

## 2014-08-20 LAB — MAGNESIUM: MAGNESIUM: 1.8 mg/dL (ref 1.5–2.5)

## 2014-08-20 MED ORDER — GADOBENATE DIMEGLUMINE 529 MG/ML IV SOLN
25.0000 mL | Freq: Once | INTRAVENOUS | Status: AC | PRN
  Administered 2014-08-20: 25 mL via INTRAVENOUS

## 2014-08-20 NOTE — Progress Notes (Signed)
UR completed 

## 2014-08-20 NOTE — Discharge Summary (Signed)
Discharge Summary  Alexandra Strickland ZOX:[id number] DOB: 13-Oct-1980  PCP: Haze Rushing, MD  Admit date: 08/18/2014 Discharge date: 08/20/2014  Time spent: <33mins  Recommendations for Outpatient Follow-up:  1. F/u with PMD in a week, pmd to repeat cmp. 2. F/u with cardiology to discuss echo/cardiac MRI result. Outpatient cardiac monitoring.  Discharge Diagnoses:  Active Hospital Problems   Diagnosis Date Noted  . LFT elevation 08/20/2014  . Syncope and collapse 08/18/2014    Resolved Hospital Problems   Diagnosis Date Noted Date Resolved  No resolved problems to display.    Discharge Condition: stable  Diet recommendation: heart healthy  Filed Weights   08/19/14 0602 08/20/14 0457  Weight: 76.7 kg (169 lb 1.5 oz) 80.604 kg (177 lb 11.2 oz)    History of present illness:  34 yo female was running in a race and doesn't recall what happened. There is question of syncope about 5.5 miles into the race. Pt is not sure how long she was out. No seizure. Pt denies cp, palp, sob. + n/v. No bloody emesis. Pt denies focal weakness, numbness, tingling. There was some element of confusion. Per family sbp 80. Pt will be admitted for syncope.    Hospital Course:  Active Problems:   Syncope and collapse   LFT elevation  Syncope: no arrhythmia in hospital, mild cardiac enzymes elevation along on CK elevation likely secondary to physical strain, echo unremarkable. CTA no PE, d/c heparin drip. Ct head no acute findings.  Improved with hydration. Cardiology consulted, recommended cardiac MRI due to family h/o Marfan. Cardiology cleared patient to be discharged with outpatient cards follow up and outpatient cardiac monitoring.  Elevation of lft, unclear etiology, unremarkable ab [place], acute hepatitis panel negative. From hypoperfusion for significant dehydration? From mild rhabdo? Encourage oral fluids intake, avoid extreme physical activity. pmd to repeat cmp in one week, consider  hepatology referral if no improvement.  Arf; likely secondary to dehydration, improved with hydration, ua concentrated, otherwise unremarkable.    Code Status: full  Family Communication: patient and husband  Disposition Plan: d/c home   Consultants:  cardiology  Procedures:  Cardiac MRI/echo  Antibiotics:  none   Discharge Exam: BP 120/64 mmHg  Pulse 52  Temp(Src) 97.8 F (36.6 C) (Oral)  Resp 18  Ht  (1.778 m)  Wt 80.604 kg (177 lb 11.2 oz)  BMI 25.50 kg/m2  SpO2 99%  LMP 08/04/2014   General: NAD  Cardiovascular: RRR  Respiratory: CTABL  Abdomen: Soft/ND/NT, positive BS  Musculoskeletal: No Edema  Neuro: aaox3   Discharge Instructions You were cared for by a hospitalist during your hospital stay. If you have any questions about your discharge medications or the care you received while you were in the hospital after you are discharged, you can call the unit and asked to speak with the hospitalist on call if the hospitalist that took care of you is not available. Once you are discharged, your primary care physician will handle any further medical issues. Please note that NO REFILLS for any discharge medications will be authorized once you are discharged, as it is imperative that you return to your primary care physician (or establish a relationship with a primary care physician if you do not have one) for your aftercare needs so that they can reassess your need for medications and monitor your lab values.  Discharge Instructions    Diet - low sodium heart healthy    Complete by:  As directed   Please encourage oral fluids  intake, avoid dehydration.     Increase activity slowly    Complete by:  As directed             Medication List    STOP taking these medications        acetaminophen 500 MG tablet  Commonly known as:  TYLENOL       Allergies  Allergen Reactions  . Phenergan [Promethazine Hcl]     Nausea   . Sulfa Antibiotics  Hives       Follow-up Information    Follow up with ANDERSON,TERESA, FNP In 1 week.   Specialty:  Nurse Practitioner   Why:  repeat cmp in one week   Contact information:   [address] [place] [address] [phone]       Follow up with Rollene RotundaJames Hochrein, MD.   Specialty:  Cardiology   Why:  office will contact you about monitor and exercise test   Contact information:   [address] [place] [address] [phone]        The results of significant diagnostics from this hospitalization (including imaging, microbiology, ancillary and laboratory) are listed below for reference.    Significant Diagnostic Studies: Ct Head Wo Contrast  08/18/2014   CLINICAL DATA:  Syncope.  Nausea.  Headache.  Altered mental status.  EXAM: CT HEAD WITHOUT CONTRAST  TECHNIQUE: Contiguous axial images were obtained from the base of the skull through the vertex without intravenous contrast.  COMPARISON:  None.  FINDINGS: No evidence of intracranial hemorrhage, brain edema, or other signs of acute infarction. No evidence of intracranial mass lesion or mass effect.  No abnormal extraaxial fluid collections identified. Ventricles are normal in size. No skull abnormality identified.  IMPRESSION: Negative noncontrast head CT.   Electronically Signed   By: Myles RosenthalJohn  Stahl M.D.   On: 08/18/2014 18:26   Ct Angio Chest Pe W/cm &/or Wo Cm  08/19/2014   CLINICAL DATA:  Recent syncopal episode, tachycardia  EXAM: CT ANGIOGRAPHY CHEST WITH CONTRAST  TECHNIQUE: Multidetector CT imaging of the chest was performed using the standard protocol during bolus administration of intravenous contrast. Multiplanar CT image reconstructions and MIPs were obtained to evaluate the vascular anatomy.  CONTRAST:  80mL OMNIPAQUE IOHEXOL 350 MG/ML SOLN  COMPARISON:  None.  FINDINGS: The lungs are well aerated bilaterally. No focal infiltrate or sizable effusion is seen.  The thoracic inlet is within normal limits.  The thoracic aorta in its branches are unremarkable. No dissection or aneurysmal dilatation is seen. Pulmonary artery is well visualized and demonstrates a normal branching pattern. No filling defects are identified to suggest pulmonary emboli.  The visualized upper abdomen is within normal limits. No bony abnormality is noted.  Review of the MIP images confirms the above findings.  IMPRESSION: No evidence of pulmonary emboli.  No acute abnormality is seen.   Electronically Signed   By: Alcide CleverMark  Lukens M.D.   On: 08/19/2014 07:16   Mr Maxine GlennMra Chest W Contrast  08/20/2014   EXAM: CARDIAC MRI  TECHNIQUE: The patient was scanned on a 1.5 Tesla GE magnet. A dedicated cardiac coil was used. Functional imaging was done using Fiesta sequences. 2,3, and 4 chamber views were done to assess for RWMA's. Modified Simpson's rule using a short axis stack was used to calculate an ejection fraction on a dedicated work Research officer, trade unionstation using Circle software. The patient received 20 cc of Multihance. After 10 minutes inversion recovery sequences were used to assess for infiltration  and scar tissue.  FINDINGS: This study was reported with the cardiac MRI.  Dalton Mclean   Electronically Signed   By: Marca Ancona M.D.   On: 08/20/2014 18:16   Mr Card Morphology Wo/w Cm  08/20/2014   CLINICAL DATA:  Syncope, family history of Marfans syndrome  EXAM: CARDIAC MRI with MRA chest  TECHNIQUE: The patient was scanned on a 1.5 Tesla GE magnet. A dedicated cardiac coil was used. Functional imaging was done using Fiesta sequences. 2,3, and 4 chamber views were done to assess for RWMA's. Modified Simpson's rule using a short axis stack was used to calculate an ejection fraction on a dedicated work Research officer, trade union. The patient received 20 cc of Multihance. After 10 minutes inversion recovery sequences were used to assess for infiltration and scar tissue.  CONTRAST:  20 cc Multihance  FINDINGS: Limited images of the lung fields showed no gross  abnormalities.  Normal left ventricular size and wall thickness. Normal wall motion and overall systolic function, EF 58%. Normal right ventricular size and systolic function. No regional RV wall motion abnormalities and no localized aneurysms. T1 images showed no fibrofatty infiltration of the RV myocardium. Normal right and left atrial sizes. No mitral valve prolapse. No significant mitral regurgitation. Trileaflet aortic valve without significant stenosis or regurgitation.  Delayed enhancement images showed no late gadolinium enhancement (LGE) in the LV or RV myocardium.  MRA angiography showed normal caliber thoracic aorta. No coarctation. The arch vessels had normal origins. The pulmonary veins drained normally to the left atrium.  MEASUREMENTS: MEASUREMENTS LV EDV 180 mL  LV SV 104 mL  LV EF 58%  Aortic root 3.2 cm  Ascending aorta 2.8 cm  Aortic arch 2.1 cm  Descending thoracic aorta 1.9 cm  IMPRESSION: 1. Normal left ventricular size and systolic function, EF 58%. No evidence for hypertrophic cardiomyopathy.  2. Normal right ventricular size and systolic function. No evidence for ARVC.  3. No myocardial LGE, so no definitive evidence for prior MI, infiltrative disease, or myocarditis.  4.  Normal caliber aorta by MR angiography.  5.  No evidence for mitral regurgitation or MV prolapse.  Dalton Mclean   Electronically Signed   By: Marca Ancona M.D.   On: 08/20/2014 18:15   US Abdomen Limited Ruq  08/19/2014   CLINICAL DATA:  Elevated LFTs.  EXAM: US ABDOMEN LIMITED - RIGHT UPPER QUADRANT  COMPARISON:  Chest CT 08/19/2014  FINDINGS: Gallbladder:  No gallstones or wall thickening visualized. No sonographic Murphy sign noted.  Common bile duct:  Diameter: 4 mm  Liver:  No focal lesion identified. Within normal limits in parenchymal echogenicity.  IMPRESSION: Unremarkable right upper quadrant ultrasound.  No cholelithiasis or sonographic evidence for acute cholecystitis.   Electronically Signed   By: Annia Belt M.D.   On: 08/19/2014 20:08    Microbiology: No results found for this or any previous visit (from the past 240 hour(s)).   Labs: Basic Metabolic Panel:  Recent Labs Lab 08/18/14 1708 08/19/14 0840 08/20/14 0036  NA 146* 137 138  K 4.0 3.5 4.1  CL 111 109 108  CO2  --  22 25  GLUCOSE 90 103* 115*  BUN CREATININE 1.20* 0.97 0.77  CALCIUM  --  8.5 8.7  MG  --   --  1.8   Liver Function Tests:  Recent Labs Lab 08/19/14 0840 08/20/14 0036  AST 156* 273*  ALT 155* 380*  ALKPHOS 69 73  BILITOT 0.9 0.7  PROT 6.1 5.3*  ALBUMIN 3.4* 3.2*   No results for input(s): LIPASE, AMYLASE in the last 168 hours. No results for input(s): AMMONIA in the last 168 hours. CBC:  Recent Labs Lab 08/18/14 1651 08/18/14 1708  WBC 4.5  --   NEUTROABS 3.2  --   HGB 12.6 13.3  HCT 37.7 39.0  MCV 90.8  --   PLT 159  --    Cardiac Enzymes:  Recent Labs Lab 08/18/14 1651 08/19/14 0255 08/19/14 1505 08/19/14 1839 08/20/14 0036  CKTOTAL 278*  --  430* 376* 320*  CKMB  --   --  7.5* 5.6* 4.7*  TROPONINI  --  0.24* 0.09* 0.07* 0.05*   BNP: BNP (last 3 results) No results for input(s): BNP in the last 8760 hours.  ProBNP (last 3 results) No results for input(s): PROBNP in the last 8760 hours.  CBG: No results for input(s): GLUCAP in the last 168 hours.     SignedAlbertine Grates MD, PhD  [facility] 08/20/2014, 8:20 PM

## 2014-08-20 NOTE — Progress Notes (Signed)
  Echocardiogram 2D Echocardiogram has been performed.  Cathie BeamsGREGORY, Rola Lennon 08/20/2014, 2:46 PM

## 2014-08-20 NOTE — Progress Notes (Signed)
NURSING PROGRESS NOTE  Alexandra CossKristen Strickland [id number] Discharge Data: 08/20/2014 5:43 PM Attending Provider: Albertine GratesFang Xu, MD WUJ:WJXBPCP:COBB, Gavin PoundEBORAH, MD   Alexandra CossKristen Luginbill to be D/C'd Home per MD order.    All IV's will be discontinued and monitored for bleeding.  All belongings will be returned to patient for patient to take home.  Last Documented Vital Signs:  Blood pressure 120/64, pulse 52, temperature 97.8 F (36.6 C), temperature source Oral, resp. rate 18, height 5\' 10"  (1.778 m), weight 80.604 kg (177 lb 11.2 oz), last menstrual period 08/04/2014, SpO2 99 %.  Madelin RearLonnie Autumnrose Yore, MSN, RN, [employer]CMSRN

## 2014-08-20 NOTE — Progress Notes (Signed)
    Subjective:  No further syncope  Objective:  Vital Signs in the last 24 hours: Temp:  [97.8 F (36.6 C)] 97.8 F (36.6 C) (04/04 0452) Pulse Rate:  [52] 52 (04/04 0452) BP: (120)/(64) 120/64 mmHg (04/04 0452) SpO2:  [99 %] 99 % (04/04 0452) Weight:  [177 lb 11.2 oz (80.604 kg)] 177 lb 11.2 oz (80.604 kg) (04/04 0457)  Intake/Output from previous day:  Intake/Output Summary (Last 24 hours) at 08/20/14 1606 Last data filed at 08/20/14 1523  Gross per 24 hour  Intake    480 ml  Output   1900 ml  Net  -1420 ml    Physical Exam: General appearance: alert, cooperative and no distress Lungs: clear to auscultation bilaterally Heart: regular rate and rhythm Extremities: no edema   Rate: 52  Rhythm: normal sinus rhythm and sinus bradycardia  Lab Results:  Recent Labs  08/18/14 1651 08/18/14 1708  WBC 4.5  --   HGB 12.6 13.3  PLT 159  --     Recent Labs  08/19/14 0840 08/20/14 0036  NA 137 138  K 3.5 4.1  CL 109 108  CO2 22 25  GLUCOSE 103* 115*  BUN 10 8  CREATININE 0.97 0.77    Recent Labs  08/19/14 1839 08/20/14 0036  TROPONINI 0.07* 0.05*   No results for input(s): INR in the last 72 hours.  Imaging: Imaging results have been reviewed  Cardiac Studies:  Assessment/Plan:   Active Problems:   Syncope and collapse   LFT elevation   PLAN: Final cardiac MRI report pending but preliminary was normal. Elevated LFTs possible secondary to shock liver. She will need to have an OP GXT and 30 day event monitor. MD to see.   Corine ShelterLuke Kilroy PA-C [facility] [phone] 08/20/2014, 4:06 PM   I have seen and examined the patient along with Corine ShelterLuke Kilroy PA-C.  I have reviewed the chart, notes and new data.  I agree with PA's note.  Overall, the sequence of events (gradual loss of consciousness, prolonged hypotension despite normal rhythm, elevated body temperature, reduced sweating, protracted confusion) is most in keeping with prolonged hypotension due to  hypovolemia (she had coffee that morning, not her usual routine before a race) Elevated LFTs and mildly abnormal cardiac troponin I suggest severe and prolonged tissue hypoperfusion. Preliminary review of MRI does not suggest structural abnormalities, including no HOCM and no RV dysplasia. ECG without epsilon wave or RBBB. Normal QT.  PLAN: Recommend treadmill stress test and 30 day event monitor to screnn for arrhythmia, although hypotension is most likely cause. Aggressive hydration during exercise.  Thurmon FairMihai Decarlo Rivet, MD, [facility] HospitalFACC [facility] [phone] 08/20/2014, 4:09 PM

## 2014-08-20 NOTE — Discharge Instructions (Signed)
Syncope °Syncope is a medical term for fainting or passing out. This means you lose consciousness and drop to the ground. People are generally unconscious for less than 5 minutes. You may have some muscle twitches for up to 15 seconds before waking up and returning to normal. Syncope occurs more often in older adults, but it can happen to anyone. While most causes of syncope are not dangerous, syncope can be a sign of a serious medical problem. It is important to seek medical care.  °CAUSES  °Syncope is caused by a sudden drop in blood flow to the brain. The specific cause is often not determined. Factors that can bring on syncope include: °· Taking medicines that lower blood pressure. °· Sudden changes in posture, such as standing up quickly. °· Taking more medicine than prescribed. °· Standing in one place for too long. °· Seizure disorders. °· Dehydration and excessive exposure to heat. °· Low blood sugar (hypoglycemia). °· Straining to have a bowel movement. °· Heart disease, irregular heartbeat, or other circulatory problems. °· Fear, emotional distress, seeing blood, or severe pain. °SYMPTOMS  °Right before fainting, you may: °· Feel dizzy or light-headed. °· Feel nauseous. °· See all white or all black in your field of vision. °· Have cold, clammy skin. °DIAGNOSIS  °Your health care provider will ask about your symptoms, perform a physical exam, and perform an electrocardiogram (ECG) to record the electrical activity of your heart. Your health care provider may also perform other heart or blood tests to determine the cause of your syncope which may include: °· Transthoracic echocardiogram (TTE). During echocardiography, sound waves are used to evaluate how blood flows through your heart. °· Transesophageal echocardiogram (TEE). °· Cardiac monitoring. This allows your health care provider to monitor your heart rate and rhythm in real time. °· Holter monitor. This is a portable device that records your  heartbeat and can help diagnose heart arrhythmias. It allows your health care provider to track your heart activity for several days, if needed. °· Stress tests by exercise or by giving medicine that makes the heart beat faster. °TREATMENT  °In most cases, no treatment is needed. Depending on the cause of your syncope, your health care provider may recommend changing or stopping some of your medicines. °HOME CARE INSTRUCTIONS °· Have someone stay with you until you feel stable. °· Do not drive, use machinery, or play sports until your health care provider says it is okay. °· Keep all follow-up appointments as directed by your health care provider. °· Lie down right away if you start feeling like you might faint. Breathe deeply and steadily. Wait until all the symptoms have passed. °· Drink enough fluids to keep your urine clear or pale yellow. °· If you are taking blood pressure or heart medicine, get up slowly and take several minutes to sit and then stand. This can reduce dizziness. °SEEK IMMEDIATE MEDICAL CARE IF:  °· You have a severe headache. °· You have unusual pain in the chest, abdomen, or back. °· You are bleeding from your mouth or rectum, or you have black or tarry stool. °· You have an irregular or very fast heartbeat. °· You have pain with breathing. °· You have repeated fainting or seizure-like jerking during an episode. °· You faint when sitting or lying down. °· You have confusion. °· You have trouble walking. °· You have severe weakness. °· You have vision problems. °If you fainted, call your local emergency services (911 in [place] Do not drive   yourself to the hospital.  MAKE SURE YOU:  Understand these instructions.  Will watch your condition.  Will get help right away if you are not doing well or get worse. Document Released: 05/04/2005 Document Revised: 05/09/2013 Document Reviewed: 07/03/2011 [facility] Patient Information 2015 [place]. This information is not intended to replace  advice given to you by your health care provider. Make sure you discuss any questions you have with your health care provider.   No driving till cleared by your doctor.

## 2014-08-21 ENCOUNTER — Encounter (HOSPITAL_COMMUNITY): Payer: Self-pay | Admitting: *Deleted

## 2014-08-23 ENCOUNTER — Encounter: Payer: Self-pay | Admitting: *Deleted

## 2014-08-23 ENCOUNTER — Encounter (INDEPENDENT_AMBULATORY_CARE_PROVIDER_SITE_OTHER): Payer: BLUE CROSS/BLUE SHIELD

## 2014-08-23 DIAGNOSIS — R55 Syncope and collapse: Secondary | ICD-10-CM

## 2014-08-23 NOTE — Progress Notes (Signed)
Patient ID: Alexandra CossKristen Strickland, female   DOB: [birth year], 34 y.o.   MRN: [id number] [employer] 30 day cardiac event monitor applied to patient.

## 2014-08-28 ENCOUNTER — Telehealth: Payer: Self-pay | Admitting: Cardiology

## 2014-08-29 ENCOUNTER — Telehealth (HOSPITAL_COMMUNITY): Payer: Self-pay | Admitting: *Deleted

## 2014-08-29 NOTE — Telephone Encounter (Signed)
Closed encounter °

## 2014-09-11 ENCOUNTER — Other Ambulatory Visit: Payer: Self-pay | Admitting: Cardiology

## 2014-09-11 DIAGNOSIS — R55 Syncope and collapse: Secondary | ICD-10-CM

## 2014-09-14 ENCOUNTER — Encounter (HOSPITAL_COMMUNITY): Payer: BLUE CROSS/BLUE SHIELD

## 2014-09-20 ENCOUNTER — Ambulatory Visit: Payer: BLUE CROSS/BLUE SHIELD | Admitting: Cardiology

## 2014-10-09 ENCOUNTER — Telehealth (HOSPITAL_COMMUNITY): Payer: Self-pay

## 2014-10-09 NOTE — Telephone Encounter (Signed)
Encounter complete. 

## 2014-10-10 ENCOUNTER — Telehealth (HOSPITAL_COMMUNITY): Payer: Self-pay

## 2014-10-10 NOTE — Telephone Encounter (Signed)
Encounter complete. 

## 2014-10-11 ENCOUNTER — Ambulatory Visit (HOSPITAL_COMMUNITY)
Admission: RE | Admit: 2014-10-11 | Discharge: 2014-10-11 | Disposition: A | Discharge status: Home / Self Care | Payer: BLUE CROSS/BLUE SHIELD | Source: Ambulatory Visit | Attending: Cardiology | Admitting: Cardiology

## 2014-10-11 DIAGNOSIS — R55 Syncope and collapse: Secondary | ICD-10-CM | POA: Insufficient documentation

## 2014-10-11 LAB — EXERCISE TOLERANCE TEST
CHL CUP RESTING HR STRESS: 93 {beats}/min
CHL RATE OF PERCEIVED EXERTION: 27520
CSEPED: 14 min
CSEPEDS: 10 s
Estimated workload: 17.2 METS
MPHR: 185 {beats}/min
Peak HR: 176 {beats}/min
Percent HR: 95 %

## 2014-10-29 ENCOUNTER — Encounter: Payer: Self-pay | Admitting: Cardiology

## 2014-10-29 ENCOUNTER — Ambulatory Visit (INDEPENDENT_AMBULATORY_CARE_PROVIDER_SITE_OTHER): Payer: BLUE CROSS/BLUE SHIELD | Admitting: Cardiology

## 2014-10-29 VITALS — BP 110/64 | HR 70 | Ht 70.0 in | Wt 162.0 lb

## 2014-10-29 DIAGNOSIS — R55 Syncope and collapse: Secondary | ICD-10-CM

## 2014-10-29 NOTE — Patient Instructions (Signed)
Eat Salty Foods  Lab 10/30/14 cmet,magnesium,urinalysis,urine sodium  Your physician recommends that you schedule a follow-up appointment Dr.Hochrein 1 to 2 months  Thurs 01/10/15 at 3:30 pm

## 2014-10-29 NOTE — Progress Notes (Signed)
Cardiology Office Note   Date:  10/29/2014   ID:  Alexandra Strickland, DOB Oct 30, 1980, MRN [id number]  PCP:  Haze Rushing, MD  Cardiologist:  Dr. Antoine Poche    Chief Complaint  Patient presents with  . Hospitalization Follow-up    no chest pain, no  shortness of breath, no edema, no pain in legs, no cramping in legs, occassional-with dehydration- lightheadedness, occassional/with dehydration dizziness      History of Present Illness: Alexandra Strickland is a 34 y.o. female who presents for results of her event monitor revealing SR and Mild SB to 54 and ETT- with no ischemia though she did develop hypotension and felt bad, which she does not during her regular exercise.  But the room was warm and when she becomes warm she begins to feel ill.    She was admitted to [facility] 08/18/14 with syncope during a race.  Thought to be out for some time-maybe for 25 min. pk troponin 0.24.     She does have a previous echo with a normal EF and no aortic root dilatation was done a couple of years ago to screen for any Marfan's changes. She's otherwise never been told she had this diagnosis but she's not had any genetic testing family or other testing. Cardiac MRI : 1. Normal left ventricular size and systolic function, EF 58%. No evidence for hypertrophic cardiomyopathy.  2. Normal right ventricular size and systolic function. No evidence for ARVC.  3. No myocardial LGE, so no definitive evidence for prior MI, infiltrative disease, or myocarditis.  4. Normal caliber aorta by MR angiography.  5. No evidence for mitral regurgitation or MV prolapse.  Labs with elevated LFTS and neg. Hepatitis.   Since discharge if she does not drink a lot of water/liquids she has headaches and does not feel well.  She drinks 120 oz offluids a day and with exercise sweats profusely.  She relates this to the heat in [place]. She no longer drinks caffeine.  We discussed using other liquids besides water.  Gatorade, powder and mix  to strength she likes.  No chest pain no SOB.     Past Medical History  Diagnosis Date  . Hyperglycemia     Very borderline    Past Surgical History  Procedure Laterality Date  . Toe surgery    . Tubal ligation       Current Outpatient Prescriptions  Medication Sig Dispense Refill  . Multiple Vitamin (MULTIVITAMIN) tablet Take 1 tablet by mouth daily.     No current facility-administered medications for this visit.    Allergies:   Phenergan and Sulfa antibiotics    Social History:  The patient  reports that she has never smoked. She does not have any smokeless tobacco history on file. She reports that she does not drink alcohol.   Family History:  The patient's family history includes Cancer in her father; Heart attack in her paternal grandfather and paternal grandmother; Heart murmur in her paternal grandmother; Marfan syndrome in her brother and brother.    ROS:  General:no colds or fevers,  weight down 15 lbs. Skin:no rashes or ulcers HEENT:no blurred vision, no congestion CV:see HPI PUL:see HPI GI:no diarrhea constipation or melena, no indigestion GU:no hematuria, no dysuria MS:no joint pain, no claudication Neuro:no syncope, no lightheadedness, some headache if any dehydration Endo:no diabetes, no thyroid disease  Wt Readings from Last 3 Encounters:  10/29/14 162 lb (73.483 kg)  08/20/14 177 lb 11.2 oz (80.604 kg)  PHYSICAL EXAM: VS:  BP 110/64 mmHg  Pulse 70  Ht 5\' 10"  (1.778 m)  Wt 162 lb (73.483 kg)  BMI 23.24 kg/m2 , BMI Body mass index is 23.24 kg/(m^2). General:Pleasant affect, NAD Skin:Warm and dry, brisk capillary refill Heart:S1S2 RRR without murmur, gallup, rub or click Lungs:clear without rales, rhonchi, or wheezes Ext:no lower ext edema, 2+ pedal pulses, 2+ radial pulses Neuro:alert and oriented X 3, MAE, follows commands, + facial symmetry    EKG:  EKG is NOT ordered today.    Recent Labs: 08/18/2014: Hemoglobin 13.3; Platelets  159 08/19/2014: TSH 2.881 08/20/2014: ALT 380*; BUN 8; Creatinine, Ser 0.77; Magnesium 1.8; Potassium 4.1; Sodium 138    Lipid Panel    Component Value Date/Time   CHOL 172 08/19/2014 1505   TRIG 19 08/19/2014 1505   HDL 74 08/19/2014 1505   CHOLHDL 2.3 08/19/2014 1505   VLDL 4 08/19/2014 1505   LDLCALC 94 08/19/2014 1505       Other studies Reviewed: Additional studies/ records that were reviewed today include: hospital notes, ETT.   ASSESSMENT AND PLAN:  1.  Syncope- stress test negative for ischemia, monitor without arrhythmia.  Now with needing 120 oz. Of liquid a day to stay hydrated.  She has no plans to run during the summer.  I discussed with Dr. Judie Petit. Croitoru may need possible tilt study.  ? Diabetes insipidus.  Will check CMP and Mag today and u/a along with urine Na.  She will follow up with Dr. Antoine Poche in 1-2 months though I will send today's note to see if he has any other suggestions.  (negative CT angio of chest and neg head CT)  Instructed to be more liberal with salt, normally she does not take in even 2000 mg or close to that number.  2. Elevated LFTs with neg. Hepatitis.    abd u/s was neg. For gall stones and liver was normal.     Current medicines are reviewed with the patient today.  The patient Has no concerns regarding medicines.  The following changes have been made:  See above Labs/ tests ordered today include:see above  Disposition:   FU:  see above  Nyoka Lint, NP  10/29/2014 4:50 PM    [facility] [address] [place], [place], [place]  [address]/ [address] [place], [place] Phone: [phone]; Fax: [phone]  [phone]

## 2014-10-30 LAB — COMPREHENSIVE METABOLIC PANEL
ALT: 36 U/L — AB (ref 0–35)
AST: 36 U/L (ref 0–37)
Albumin: 4.6 g/dL (ref 3.5–5.2)
Alkaline Phosphatase: 58 U/L (ref 39–117)
BILIRUBIN TOTAL: 0.5 mg/dL (ref 0.2–1.2)
BUN: 18 mg/dL (ref 6–23)
CALCIUM: 10.3 mg/dL (ref 8.4–10.5)
CO2: 29 mEq/L (ref 19–32)
CREATININE: 0.96 mg/dL (ref 0.50–1.10)
Chloride: 101 mEq/L (ref 96–112)
GLUCOSE: 90 mg/dL (ref 70–99)
Potassium: 4.5 mEq/L (ref 3.5–5.3)
Sodium: 138 mEq/L (ref 135–145)
Total Protein: 7.3 g/dL (ref 6.0–8.3)

## 2014-10-30 LAB — MAGNESIUM: MAGNESIUM: 2 mg/dL (ref 1.5–2.5)

## 2014-10-31 LAB — URINALYSIS
Bilirubin Urine: NEGATIVE
Glucose, UA: NEGATIVE mg/dL
Hgb urine dipstick: NEGATIVE
Ketones, ur: NEGATIVE mg/dL
LEUKOCYTES UA: NEGATIVE
Nitrite: NEGATIVE
PH: 5.5 (ref 5.0–8.0)
PROTEIN: NEGATIVE mg/dL
Specific Gravity, Urine: <1.005 — ABNORMAL LOW (ref 1.005–1.030)
UROBILINOGEN UA: 0.2 mg/dL (ref 0.0–1.0)

## 2014-10-31 LAB — SODIUM, URINE, RANDOM: Sodium, Ur: <15 mEq/L

## 2015-01-10 ENCOUNTER — Ambulatory Visit: Payer: BLUE CROSS/BLUE SHIELD | Admitting: Cardiology

## 2016-01-30 ENCOUNTER — Ambulatory Visit (INDEPENDENT_AMBULATORY_CARE_PROVIDER_SITE_OTHER): Payer: 59 | Admitting: Internal Medicine

## 2016-01-30 ENCOUNTER — Encounter: Payer: Self-pay | Admitting: Internal Medicine

## 2016-01-30 VITALS — BP 108/80 | HR 85 | Ht 70.0 in | Wt 173.0 lb

## 2016-01-30 DIAGNOSIS — R7989 Other specified abnormal findings of blood chemistry: Secondary | ICD-10-CM

## 2016-01-30 DIAGNOSIS — R5382 Chronic fatigue, unspecified: Secondary | ICD-10-CM | POA: Insufficient documentation

## 2016-01-30 DIAGNOSIS — L659 Nonscarring hair loss, unspecified: Secondary | ICD-10-CM | POA: Insufficient documentation

## 2016-01-30 NOTE — Patient Instructions (Addendum)
Please come back at 8 am, fasting for a cosynthropin stimulation test.  Please decrease the NatureThroid to 16.25 mg daily in am for the next 2 weeks, then stop.  Cut down on your coffee to 1 mug a day.  Try a vegan diet for at least 3 weeks.  Please come back for a thyroid test after 5 weeks of coming off thyroid hormone. Please let me know if you are also off Spironolactone by then so we can check a testosterone level.  Please consider the following ways to cut down carbs and fat and increase fiber and micronutrients in your diet: - substitute whole grain for white bread or pasta - substitute brown rice for white rice - substitute 90-calorie flatbread pieces for slices of bread when possible - substitute sweet potatoes or yams for white potatoes - substitute humus for margarine - substitute tofu for cheese when possible - substitute almond or rice milk for regular milk - substitute dark chocolate for other sweets when possible - substitute water - can add lemon/orange/lime/kiwi slices for taste - for diet sodas (artificial sweeteners will trick your body that you can eat sweets without getting calories and will lead you to overeating and weight gain in the long run) - do not skip breakfast or other meals (this will slow down the metabolism and will result in more weight gain over time)  - can try smoothies made from fruit and almond/rice milk in am instead of regular breakfast - can also try old-fashioned (not instant) oatmeal made with almond/rice milk in am - order the dressing on the side when eating salad at a restaurant (pour less than half of the dressing on the salad) - eat as little meat as possible  - can try juicing, but should not forget that juicing will get rid of the fiber, so would alternate with eating raw veg./fruits or drinking smoothies - use as little oil as possible, even when using olive oil - can dress a salad with a mix of balsamic vinegar and lemon juice, for  e.g. - use agave nectar, stevia sugar, or regular sugar rather than artificial sweateners - steam or broil/roast veggies  - snack on veggies/fruit/nuts (unsalted, preferably) when possible, rather than processed foods - reduce or eliminate aspartame in diet (it is in diet sodas, chewing gum, etc) Read the labels!  Try to read Dr. Katherina RightNeal Barnard's book: "Program for Reversing Diabetes" for the vegan concept and other ideas for healthy eating.   Plant-based diet materials: - Lectures (you tube):  Lequita AsalNeal Barnard: "Breaking the Food Seduction"  Doug Lisle: "How to Lose Weight, without Losing Your Mind" - Documentaries:  Supersize Me  [employer]  Forks over BorgWarnerKnives  Vegucated  Fat, Sick and Nearly Dead  The Edison InternationalWeight of the Nationwide Mutual Insuranceation  Overweight and undernourished - Books:  Lequita AsalNeal Barnard: "Program for Reversing Diabetes"  Lequita AsalNeal Barnard: "The Coca-ColaCheese Trap"  Michael Greger: "How Not to Die"  Ferol Luzolin Campbell: "The Armeniahina Study"  Konrad PentaDonna Klein: "Supermarket Vegan" (cookbook) - Facebook pages:   Reece AgarForks versus Knives  Nutrition facts  Vegucated  Toys ''R'' UsVegNews Magazine  Food Matters  Chef AJ  We will schedule another appt if the results are abnormal.

## 2016-01-30 NOTE — Progress Notes (Signed)
Patient ID: Alexandra CossKristen Bauernfeind, female   DOB: 1980/09/01, 35 y.o.   MRN: [id number]    HPI  Alexandra Strickland is a 35 y.o.-year-old female, referred by her PCP, Dr. Allison Quarryobb for management of abnormal thyroid tests (low T3) and fatigue.  Pt. Started to notice hair loss in 2014 >> continues today. She developed low endurance (she does crossfit, runs 1/2 marathons), then temperature intolerance, dehydration, nail dystrophy, cystic acne. She has had normal menses through all this. She is still exercising and eating healthy.   She was then had a slightly elevated TSH. Recently, she has seen a [facility]urop[facility] (08/2015) [facility] who checked her thyroid tests and they were all normal exc. Slightly low fT3. She was started on Naturethroid 16.25 mg twice a day. She does not feel better on this.  She also had a low progesterone (5) >> started bioidentical progesterone for the second half of her cycle >> does not feel better on this. She also has heavy menses. She saw ObGyn >> no endometriosis. ObGyn suggested hysterectomy.   Shas been tested for several food allergies >> she is trying to stay away from these foods.  Now working with a Psychologist, educationaltrainer. Trying to cut down on her workout.   She takes the thyroid hormone: - fasting - with water - separated by >30 min from b'fast  - no calcium, iron, PPIs, multivitamins   I reviewed pt's thyroid tests: 08/29/2015: TSH 2.078, free T4 1.16, free T3 1.9 (2-4.4), reverse T3 13.3 (9.2-24.1), TPO antibodies 14 (0-34), ATA antibodies <1.0    Lab Results  Component Value Date   TSH 2.881 08/19/2014    Pt describes: - + fatigue - + hair thinning and loss - + weight gain - + heat and cold intolerance - + depression - + constipation - no dry skin  Pt denies feeling nodules in neck, hoarseness, dysphagia/odynophagia, SOB with lying down.  She has + FH of thyroid disorders in: father - died at 3142 from an unknown cancer. ? FH of thyroid cancer. + FH of  Raynauds. No h/o radiation tx to head or neck. No recent use of iodine supplements.  Pt. also has a history of anemia.   I reviewed the rest of the investigation from the naturopath: 09/18/2015: Estradiol 74.2, progesterone 5.0 08/29/2015: Cortisol 8.3 (at 1.01 PM), CRP 0.4 (0-3), GGT 29 (0-60), vitamin B12 867, copper 128 (72-166), zinc 70 (56-134), ferritin  34 (15-150)   ROS: Constitutional: see HPI Eyes: no blurry vision, no xerophthalmia ENT: + sore throat, no nodules palpated in throat, no dysphagia/odynophagia, no hoarseness Cardiovascular: no CP/palpitations/leg swelling Respiratory: no cough/+ SOB Gastrointestinal: no N/V/D/C Musculoskeletal: + both: muscle/joint aches Skin: no rashes, + hair loss Neurological: no tremors/numbness/tingling/dizziness Psychiatric:+ depression/no anxiety  Past Medical History:  Diagnosis Date  . Hyperglycemia    Very borderline   Past Surgical History:  Procedure Laterality Date  . TOE SURGERY    . TUBAL LIGATION     Social History   Social History  . Marital status: Married    Spouse name: N/A  . Number of children: 3   Occupational History  . Psychologist, educationalnterior decorator   Social History Main Topics  . Smoking status: Never Smoker  . Smokeless tobacco: No  . Alcohol use No  . Drug use: no   Social History Narrative   Lives at home with husband and 3 children.    Current Outpatient Prescriptions on File Prior to Visit  Medication Sig Dispense Refill  .  Multiple Vitamin (MULTIVITAMIN) tablet Take 1 tablet by mouth daily.     No current facility-administered medications on file prior to visit.    Allergies  Allergen Reactions  . Phenergan [Promethazine Hcl]     Nausea Pt doesn't think she is allergic to this med  . Sulfa Antibiotics Hives   Family History  Problem Relation Age of Onset  . Cancer Father     Died age 10  . Marfan syndrome Brother     AVR  . Heart attack Paternal Grandmother   . Heart murmur Paternal  Grandmother   . Heart attack Paternal Grandfather   . Marfan syndrome Brother    PE: BP 108/80 (BP Location: Left Arm, Patient Position: Sitting)   Pulse 85   Ht 5\' 10"  (1.778 m)   Wt 173 lb (78.5 kg)   LMP 01/10/2016   SpO2 96%   BMI 24.82 kg/m  Wt Readings from Last 3 Encounters:  01/30/16 173 lb (78.5 kg)  10/29/14 162 lb (73.5 kg)  08/20/14 177 lb 11.2 oz (80.6 kg)   Constitutional: normal weight, fit, in NAD Eyes: PERRLA, EOMI, no exophthalmos ENT: moist mucous membranes, no thyromegaly, no cervical lymphadenopathy Cardiovascular: RRR, No MRG Respiratory: CTA B Gastrointestinal: abdomen soft, NT, ND, BS+ Musculoskeletal: no deformities, strength intact in all 4 Skin: moist, warm, no rashes Neurological: no tremor with outstretched hands, DTR normal in all 4  ASSESSMENT: 1. Low T3  2. Fatigue  3. Hair loss  PLAN:  1. Patient with Low T3 level found during investigation for fatigue and hair loss. I reviewed her thyroid tests along with the patient and I explained that her free T3 was minimally low: 1.9 (lower limit of normal 2.0), with a normal free T4, reverse T3, TSH, and TPO and ATA antibodies. In this situation, the low T3 could be secondary to its fluctuating circadian rhythm and short half life and not real thyroid pathology. I explained that I would not suggest thyroid hormone replacement since thyroid appears to be functioning normally. She agrees with this and we designed a plan to come off the Naturethroid.  - she appears euthyroid, But complains of fatigue when she wakes up and during exercise, weight gain, hair loss. We discussed about other etiology for the above symptoms (see below) - she does not appear to have a goiter, thyroid nodules, or neck compression symptoms - will check thyroid tests today: TSH, free T4, free T3 in 5 weeks after she comes off Naturethroid - Will schedule a follow-up appointment if the results are abnormal  2. Fatigue - We reviewed  together her previous labs. She is concerned about adrenal insufficiency, but I explained that her recent cortisol level was normal. Since she is very concerned about this, I will schedule her to come back for a cosyntropin stimulation test. - She was not tested for vitamin D deficiency, so we will do this at the next lab draw - We also reviewed her vitamin B12 level, which was normal - We reviewed her thyroid tests, which, except for the slightly low T3, which is usually of no clinical consequence, are normal - I also suggested to stop Prometrium since this can also cause fatigue - We also discussed at length about diet and I suggested a vegan diet. She appears interested in this and I gave her more references about the diet  3. Hair loss - She is on spironolactone, therefore, I cannot check her testosterone level. I suggested that she tries to  come off spironolactone, and in that case, we can check a testosterone level with the next set of thyroid tests. - She refuses OCPs - Her B12 vitamin is normal - She is taking a multivitamin - Her ferritin was normal - Zinc and Copper were normal - I think this may improve also on the vegan diet.  Orders Placed This Encounter  Procedures  . Vitamin D, 25-hydroxy  . Cortisol  . ACTH  . Cortisol  . Cortisol   - time spent with the patient: 1 hour, of which >50% was spent in obtaining information about her symptoms, reviewing her previous labs, evaluations, and treatments, counseling her about her condition (please see the discussed topics above), and developing a plan to further investigate it; she had a large number of questions which I addressed.  Component     Latest Ref Rng & Units 02/07/2016 02/07/2016 02/07/2016         8:40 AM  9:29 AM 10:10 AM  VITD     30.00 - 100.00 ng/mL 41.43    Cortisol, Plasma     ug/dL 16.1 09.6 04.5  W098 ACTH     6 - 50 pg/mL 18     Cosyntropin stimulation test is normal, therefore, no signs of adrenal  insufficiency.   Component     Latest Ref Rng & Units 02/07/2016  VITD     30.00 - 100.00 ng/mL 41.43  Vitamin D level is normal.  Alexandra Pavlov, MD PhD [facility]

## 2016-02-07 ENCOUNTER — Other Ambulatory Visit: Payer: 59

## 2016-02-07 ENCOUNTER — Other Ambulatory Visit (INDEPENDENT_AMBULATORY_CARE_PROVIDER_SITE_OTHER): Payer: 59

## 2016-02-07 DIAGNOSIS — R5382 Chronic fatigue, unspecified: Secondary | ICD-10-CM

## 2016-02-07 LAB — CORTISOL
CORTISOL PLASMA: 13.4 ug/dL
CORTISOL PLASMA: 20.3 ug/dL
CORTISOL PLASMA: 23.2 ug/dL

## 2016-02-07 LAB — VITAMIN D 25 HYDROXY (VIT D DEFICIENCY, FRACTURES): VITD: 41.43 ng/mL (ref 30.00–100.00)

## 2016-02-07 MED ORDER — COSYNTROPIN NICU IV SYRINGE 0.25 MG/ML (STANDARD DOSE)
0.2500 mg | Freq: Once | INTRAVENOUS | Status: AC
  Administered 2016-02-07: 0.25 mg via INTRAMUSCULAR

## 2016-02-10 LAB — ACTH: C206 ACTH: 18 pg/mL (ref 6–50)

## 2016-05-22 IMAGING — CT CT HEAD W/O CM
1 series · 16 of 27 positions shown, 20 images · non-contrast
Comparison: None.

CLINICAL DATA: Syncope.  Nausea.  Headache.  Altered mental status.

EXAM:
CT HEAD WITHOUT CONTRAST
TECHNIQUE: Contiguous axial images were obtained from the base of the skull
through the vertex without intravenous contrast.

[Series 2: head 5.0 h30s · axial · 0.44mm/px · z∈[+1277,+1397]mm · 16 of 27 slices shown, 20 images]
[im 2/27  brain]
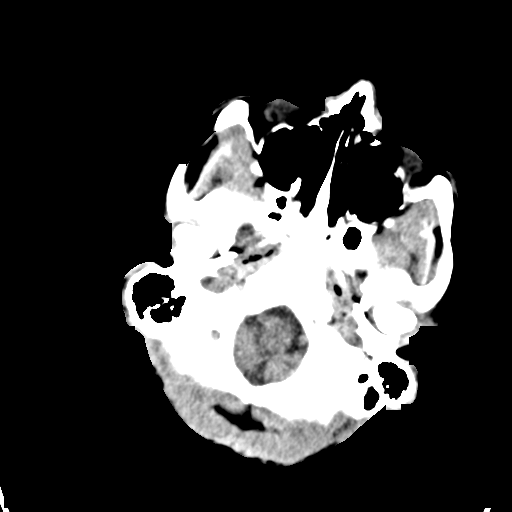
[im 2/27  bone]
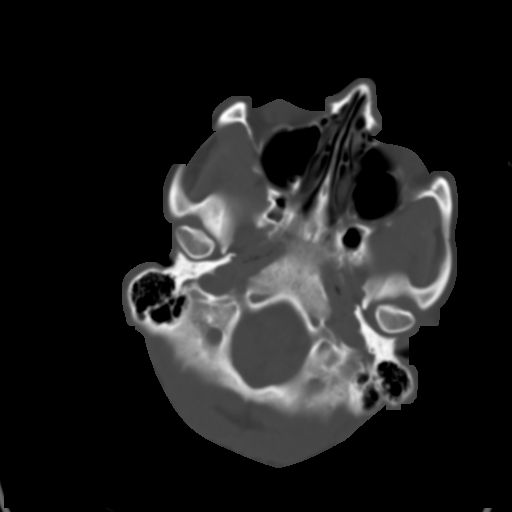
[im 4/27  brain]
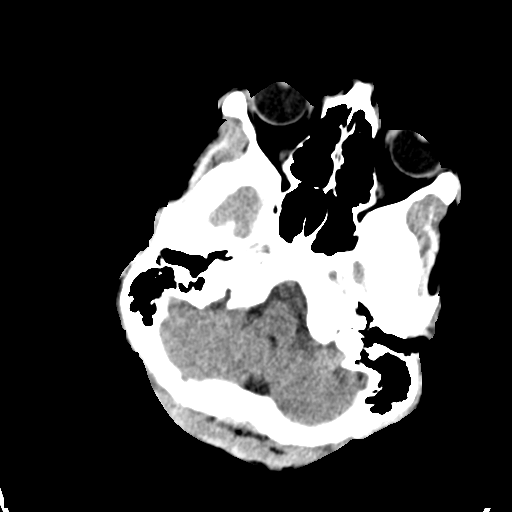
[im 5/27  brain]
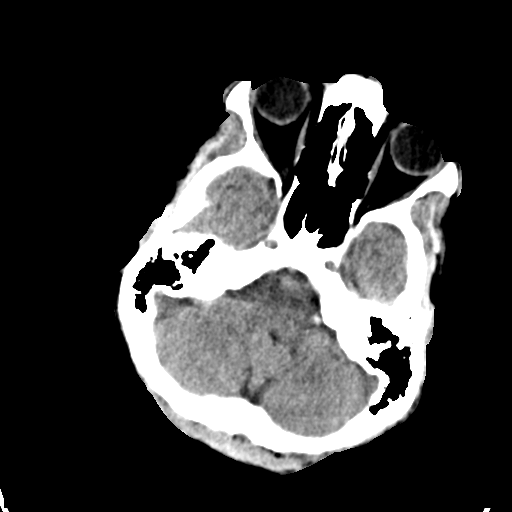
[im 7/27  brain]
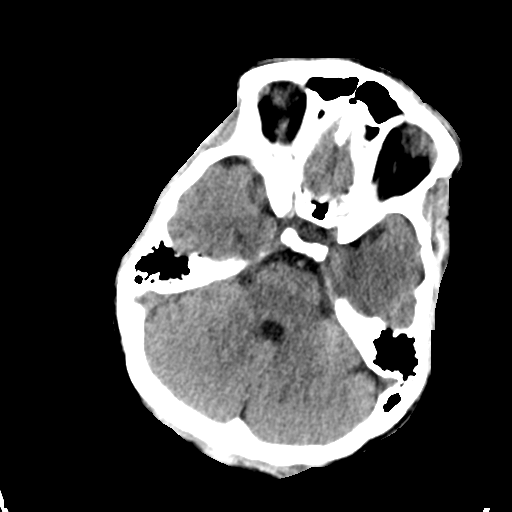
[im 9/27  brain]
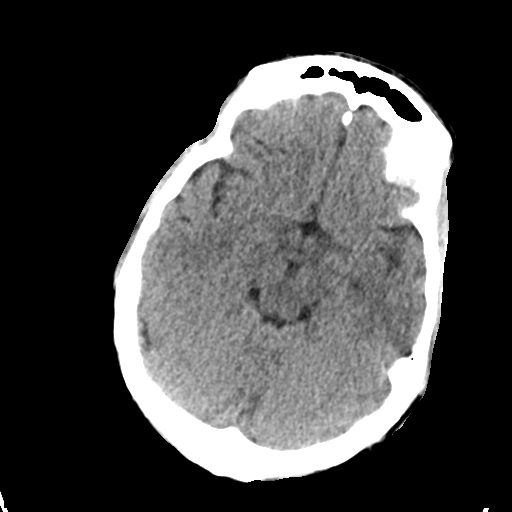
[im 9/27  bone]
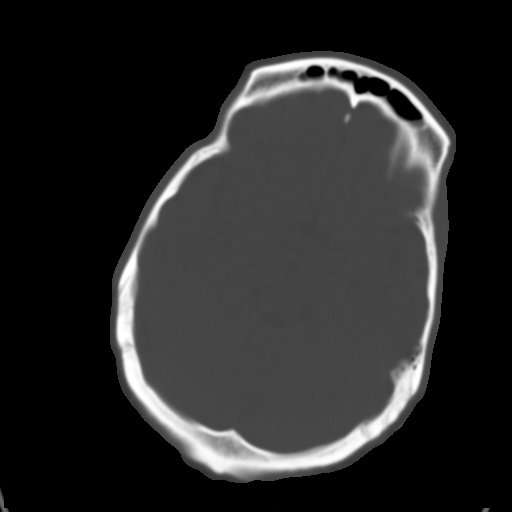
[im 10/27  brain]
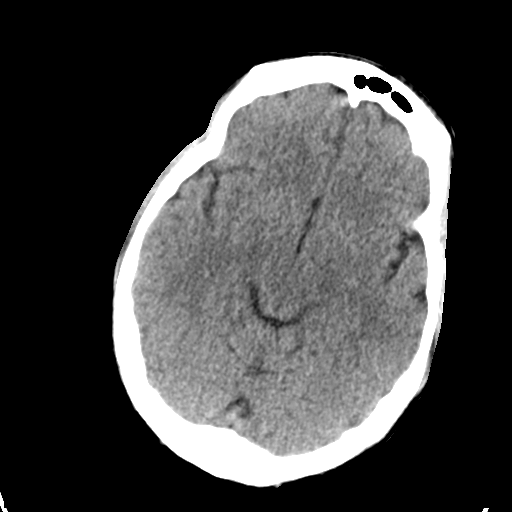
[im 12/27  brain]
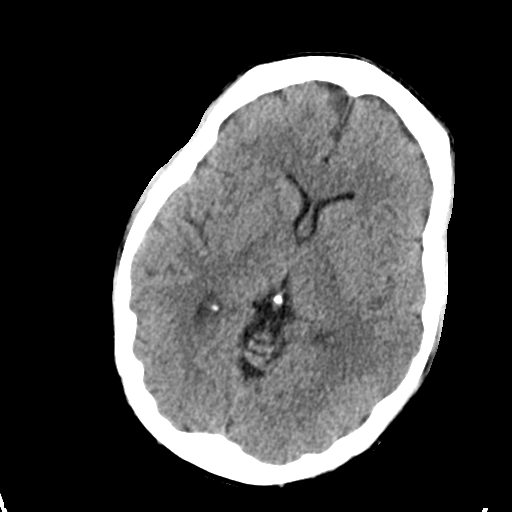
[im 13/27  brain]
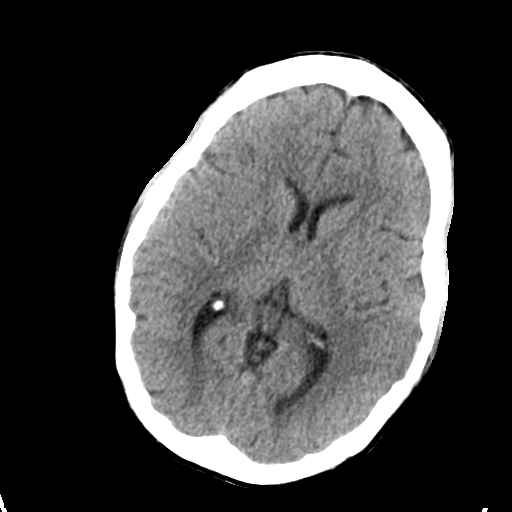
[im 15/27  brain]
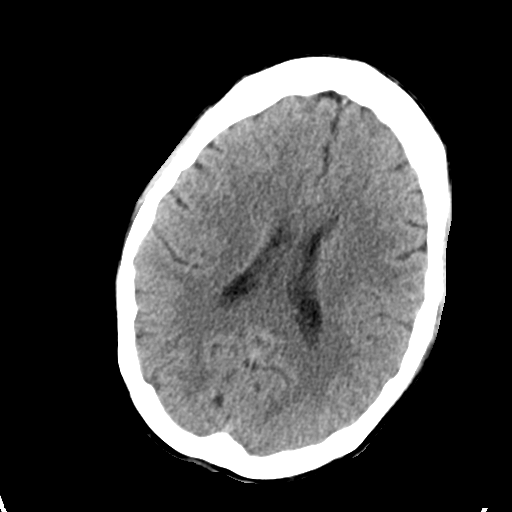
[im 15/27  bone]
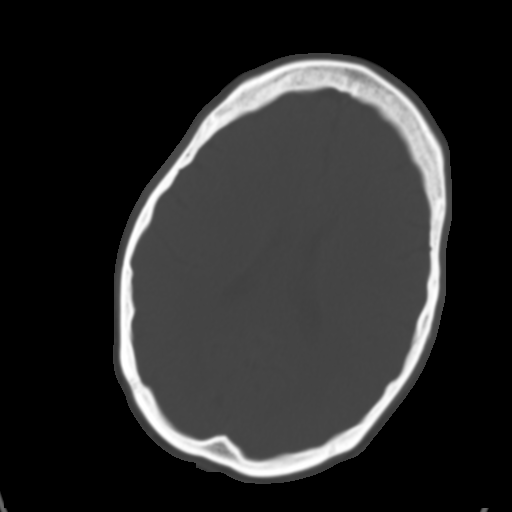
[im 16/27  brain]
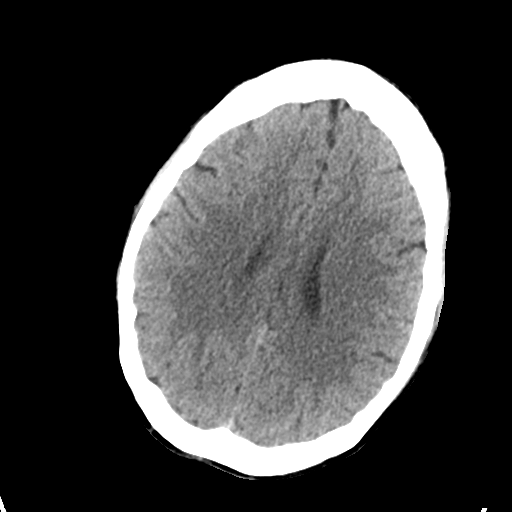
[im 18/27  brain]
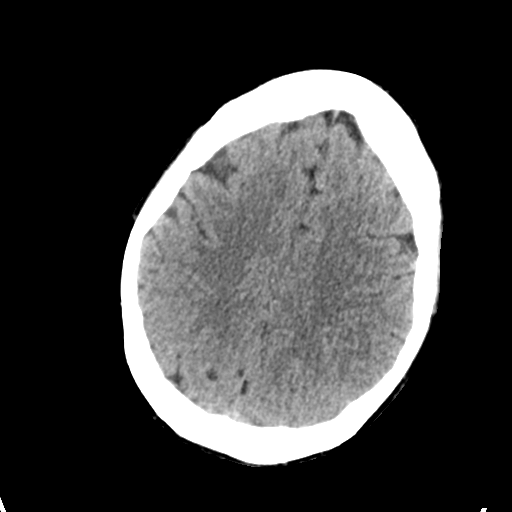
[im 19/27  brain]
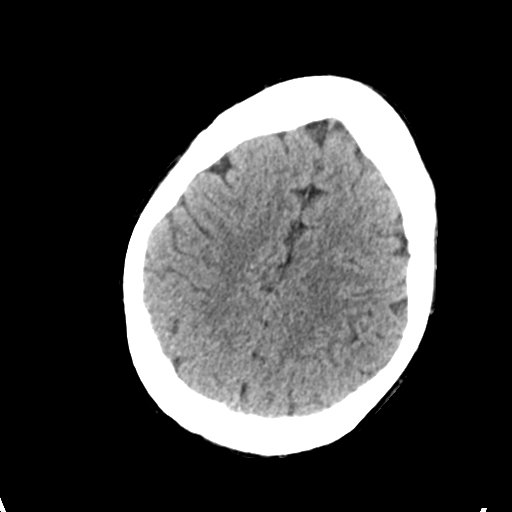
[im 21/27  brain]
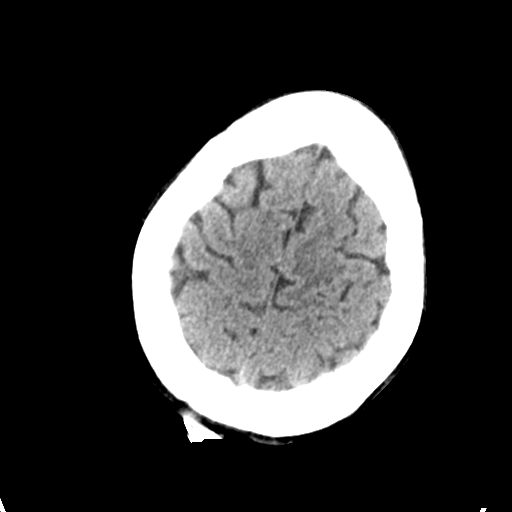
[im 21/27  bone]
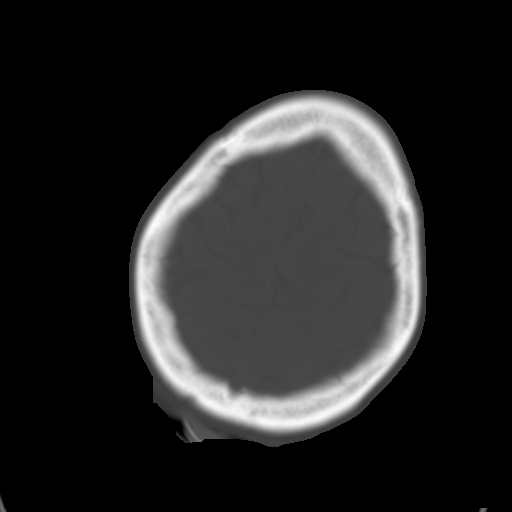
[im 23/27  brain]
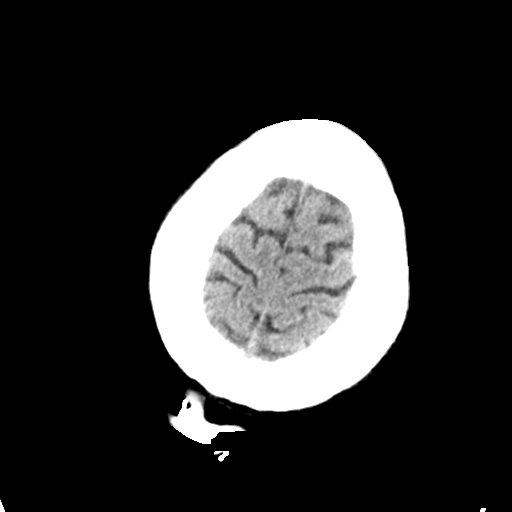
[im 24/27  brain]
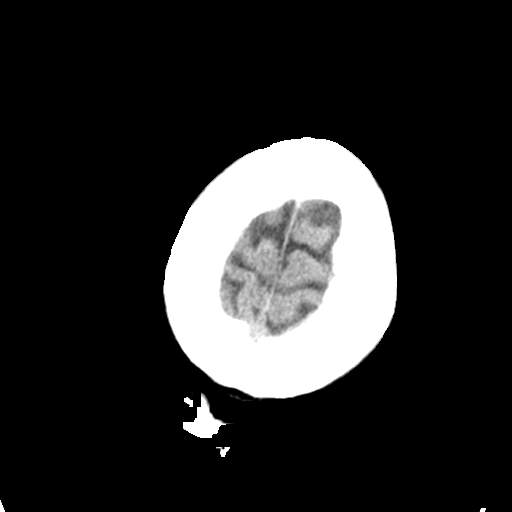
[im 26/27  brain]
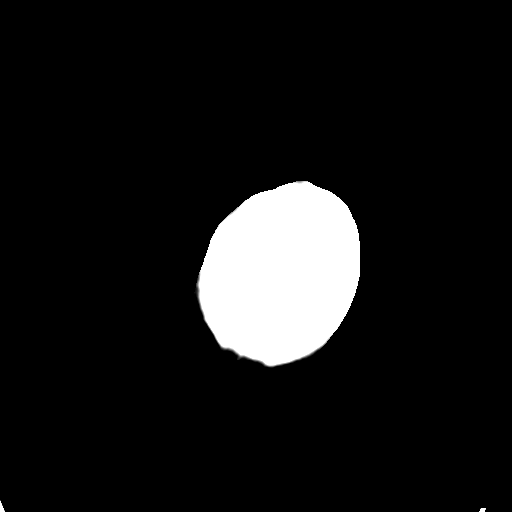

[16 of 27 positions shown; findings below may reference images not displayed]

FINDINGS: No evidence of intracranial hemorrhage, brain edema, or other signs
of acute infarction. No evidence of intracranial mass lesion or mass
effect.

No abnormal extraaxial fluid collections identified. Ventricles are
normal in size. No skull abnormality identified.
IMPRESSION: Negative noncontrast head CT.

## 2016-05-23 IMAGING — CT CT ANGIO CHEST
1 of 8 series · 17 of 36 positions shown · IV contrast (omnipaque)
Comparison: None.

CLINICAL DATA: Recent syncopal episode, tachycardia

EXAM:
CT ANGIOGRAPHY CHEST WITH CONTRAST
TECHNIQUE: Multidetector CT imaging of the chest was performed using the
standard protocol during bolus administration of intravenous
contrast. Multiplanar CT image reconstructions and MIPs were
obtained to evaluate the vascular anatomy.
CONTRAST:  80mL OMNIPAQUE IOHEXOL 350 MG/ML SOLN

[Series 406: thins pacs · axial · 0.68mm/px · z∈[-613,-345]mm · 17 of 302 slices shown]
[im 17/302  lung]
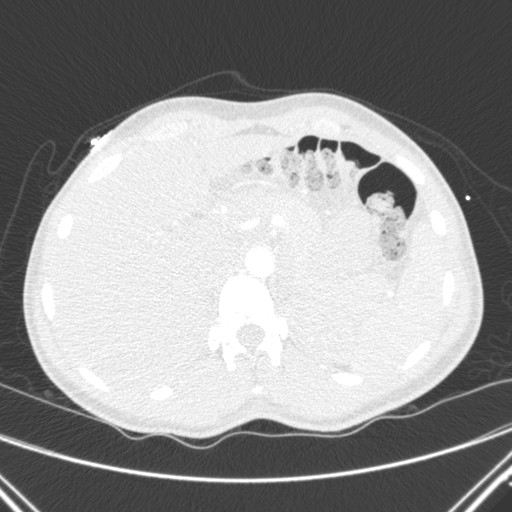
[im 34/302  mediastinal]
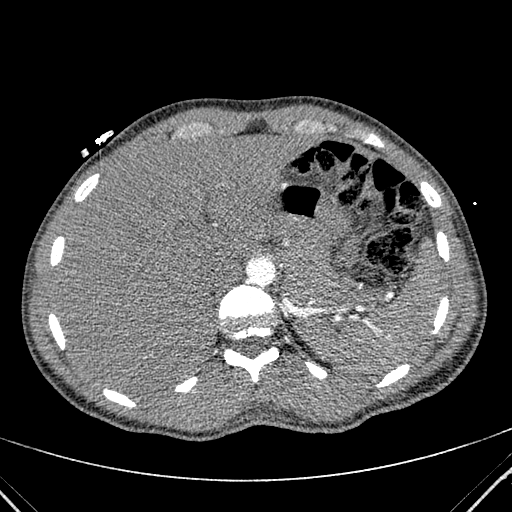
[im 51/302  lung]
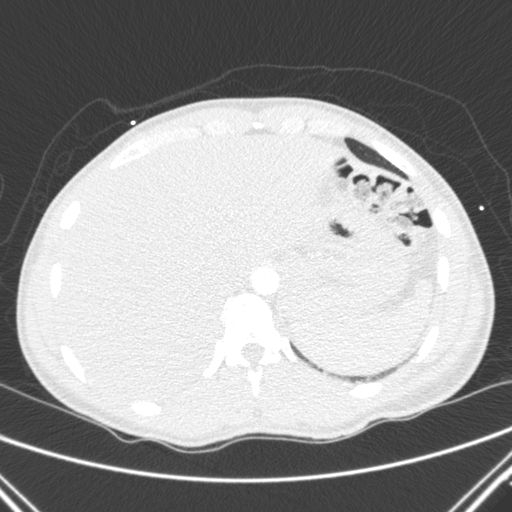
[im 67/302  mediastinal]
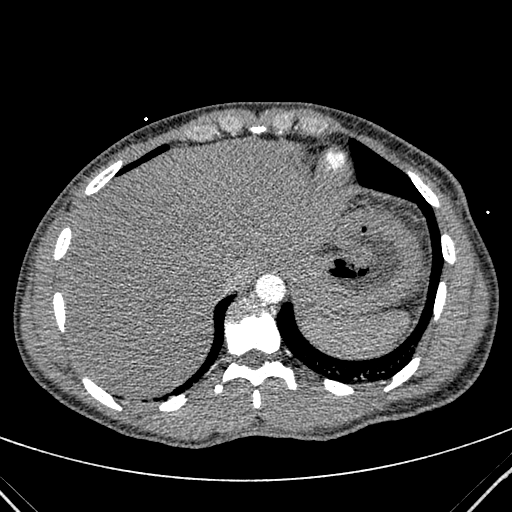
[im 84/302  lung]
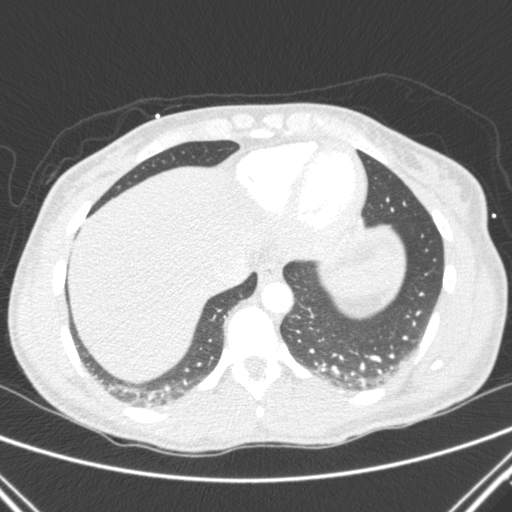
[im 101/302  mediastinal]
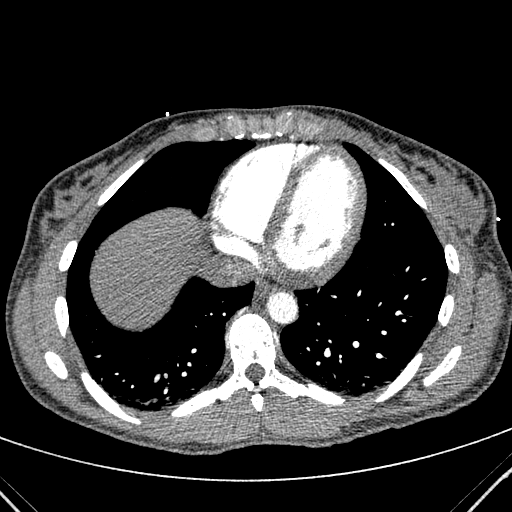
[im 118/302  lung]
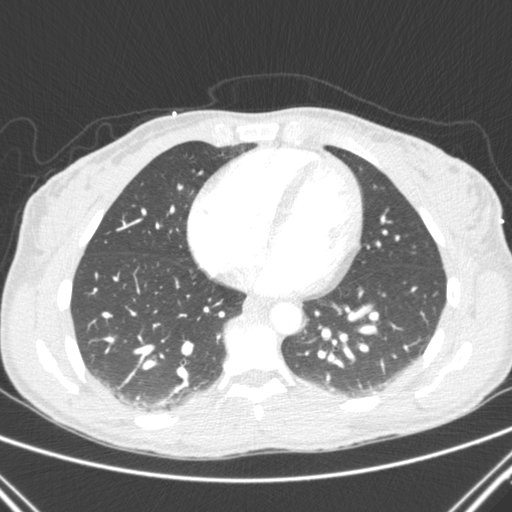
[im 134/302  mediastinal]
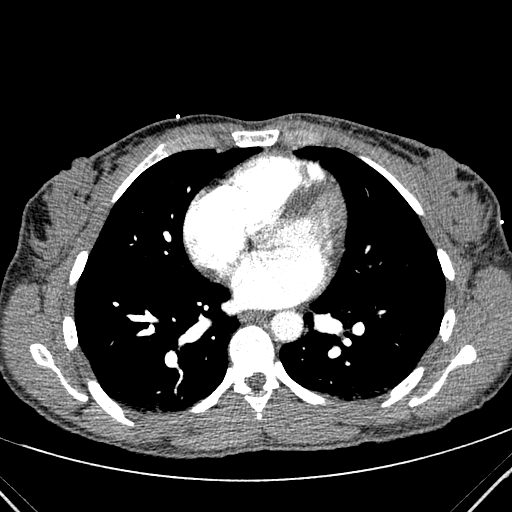
[im 151/302  lung]
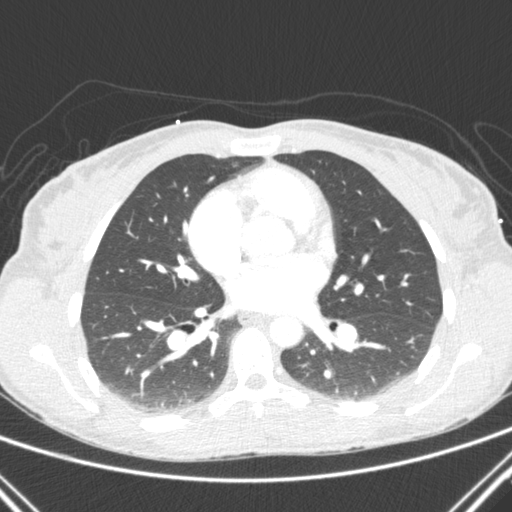
[im 168/302  mediastinal]
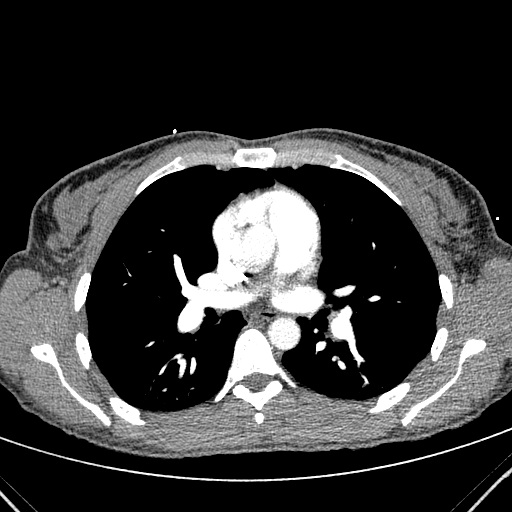
[im 184/302  lung]
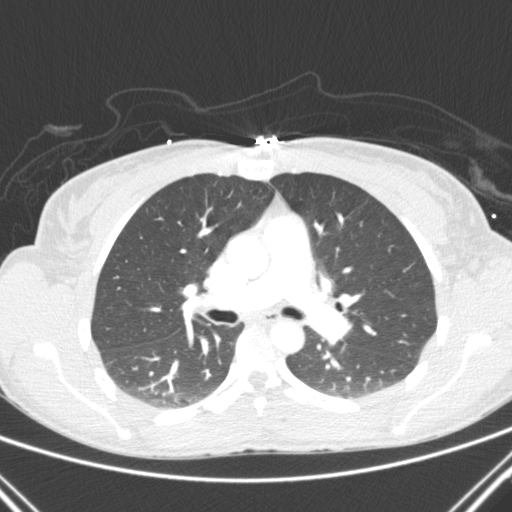
[im 201/302  mediastinal]
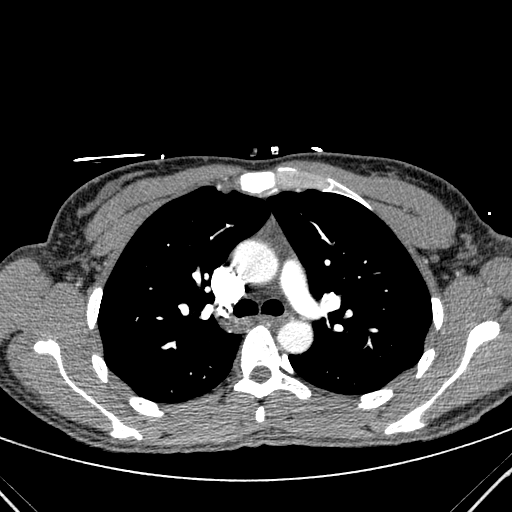
[im 218/302  lung]
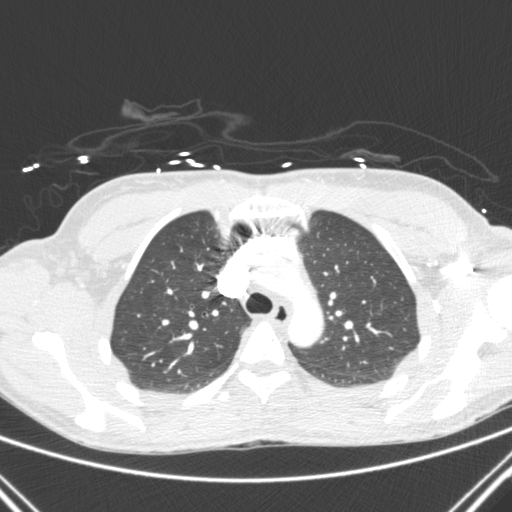
[im 235/302  mediastinal]
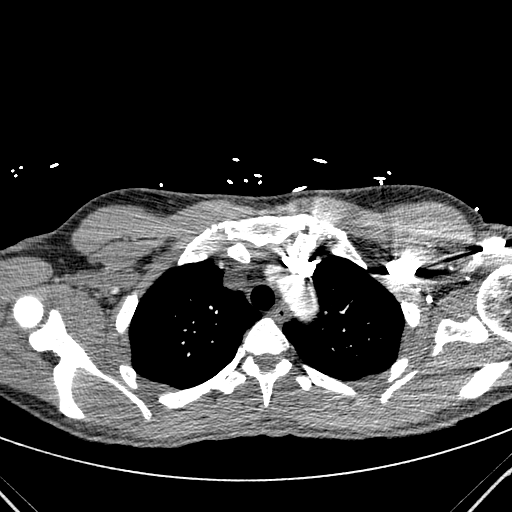
[im 251/302  lung]
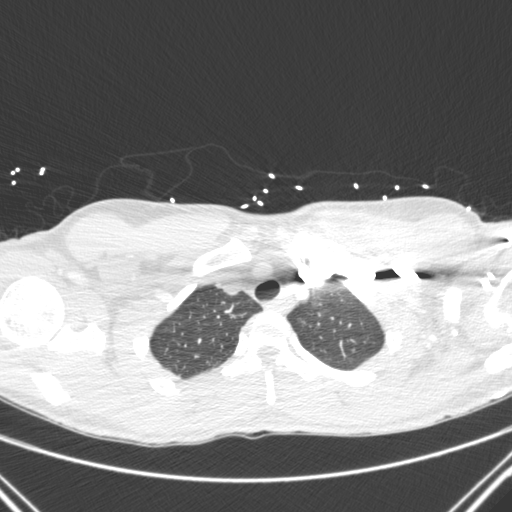
[im 268/302  mediastinal]
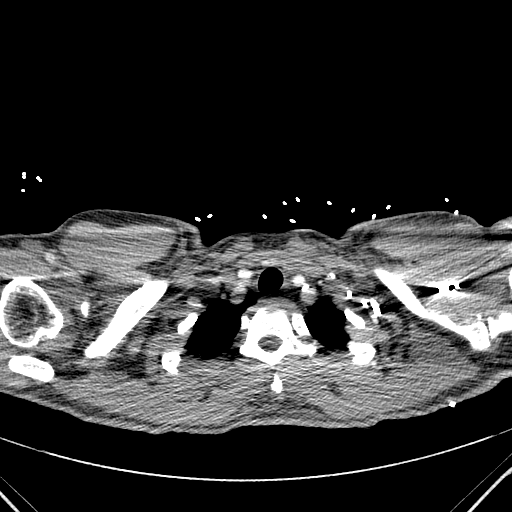
[im 285/302  lung]
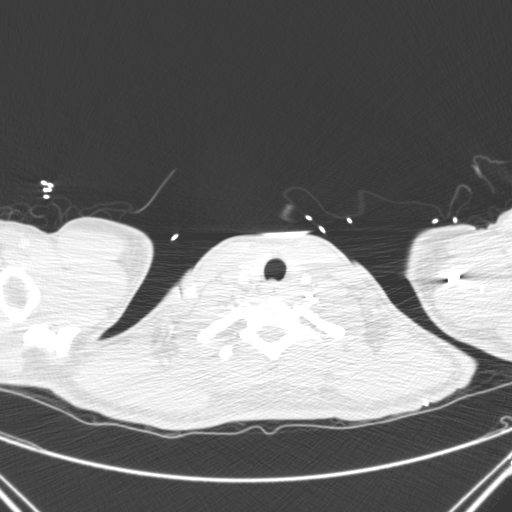

[17 of 36 positions shown; findings below may reference images not displayed]

FINDINGS: The lungs are well aerated bilaterally. No focal infiltrate or
sizable effusion is seen.

The thoracic inlet is within normal limits. The thoracic aorta in
its branches are unremarkable. No dissection or aneurysmal
dilatation is seen. Pulmonary artery is well visualized and
demonstrates a normal branching pattern. No filling defects are
identified to suggest pulmonary emboli.

The visualized upper abdomen is within normal limits. No bony
abnormality is noted.

Review of the MIP images confirms the above findings.
IMPRESSION: No evidence of pulmonary emboli.

No acute abnormality is seen.

## 2016-06-16 ENCOUNTER — Other Ambulatory Visit (HOSPITAL_COMMUNITY): Payer: Self-pay | Admitting: *Deleted

## 2016-06-17 ENCOUNTER — Ambulatory Visit (HOSPITAL_COMMUNITY)
Admission: RE | Admit: 2016-06-17 | Discharge: 2016-06-17 | Disposition: A | Discharge status: Home / Self Care | Payer: 59 | Source: Ambulatory Visit | Attending: Obstetrics and Gynecology | Admitting: Obstetrics and Gynecology

## 2016-06-17 MED ORDER — SODIUM CHLORIDE 0.9 % IV SOLN
510.0000 mg | INTRAVENOUS | Status: DC
  Administered 2016-06-17: 510 mg via INTRAVENOUS
  Filled 2016-06-17: qty 17

## 2016-06-22 ENCOUNTER — Ambulatory Visit (HOSPITAL_COMMUNITY)
Admission: RE | Admit: 2016-06-22 | Discharge: 2016-06-22 | Disposition: A | Discharge status: Home / Self Care | Payer: 59 | Source: Ambulatory Visit | Attending: Obstetrics and Gynecology | Admitting: Obstetrics and Gynecology

## 2016-06-22 MED ORDER — SODIUM CHLORIDE 0.9 % IV SOLN
510.0000 mg | INTRAVENOUS | Status: AC
  Administered 2016-06-22: 09:00:00 510 mg via INTRAVENOUS
  Filled 2016-06-22: qty 17

## 2016-09-08 NOTE — H&P (Signed)
Alexandra Strickland is an 36 y.o. female G3P3 presents for surgical mngt of menorrhagia.  Pt failed medical mngt and declines ablation or mirena IUD.    Menstrual History: No LMP recorded.    Past Medical History:  Diagnosis Date  . Hyperglycemia    Very borderline    Past Surgical History:  Procedure Laterality Date  . TOE SURGERY    . TUBAL LIGATION    SVD x 3  Family History  Problem Relation Age of Onset  . Cancer Father     Died age 69  . Marfan syndrome Brother     AVR  . Heart attack Paternal Grandmother   . Heart murmur Paternal Grandmother   . Heart attack Paternal Grandfather   . Marfan syndrome Brother     Social History:  reports that she has never smoked. She does not have any smokeless tobacco history on file. She reports that she does not drink alcohol. Her drug history is not on file.  Allergies:  Allergies  Allergen Reactions  . Phenergan [Promethazine Hcl]     Nausea Pt doesn't think she is allergic to this med  . Sulfa Antibiotics Hives    Meds:  Nature thyroid  ROS  AF, VSS Physical Exam  Gen - NAD Abd - soft, NT/ND CV - RRR Lungs - clear Ext - NT, no edema PV - uterus mobile, no adnexal masses  PV [place]:  Normal ovaries and no adnexal masses.  No free fluid  Assessment/Plan:  Menorrhagia LAVH/BS, possible TAH/BS  Alexandra Strickland 09/08/2016, 9:12 AM

## 2016-09-17 ENCOUNTER — Ambulatory Visit: Admit: 2016-09-17 | Payer: 59 | Admitting: Obstetrics and Gynecology

## 2016-09-17 SURGERY — HYSTERECTOMY, VAGINAL, LAPAROSCOPY-ASSISTED
Anesthesia: General

## 2016-12-09 NOTE — H&P (Addendum)
Alexandra Strickland is an 36 y.o. female G3P3 with menorrhagia & dysmenorrhea presents for surgical mngt.  Pt did not have improvement of her sx with progestin IUD.  Declines ablation and OCPs.       Past Medical History:  Diagnosis Date  . Hyperglycemia    Very borderline    Past Surgical History:  Procedure Laterality Date  . TOE SURGERY    . TUBAL LIGATION      Family History  Problem Relation Age of Onset  . Cancer Father        Died age 37  . Marfan syndrome Brother        AVR  . Heart attack Paternal Grandmother   . Heart murmur Paternal Grandmother   . Heart attack Paternal Grandfather   . Marfan syndrome Brother     Social History:  reports that she has never smoked. She does not have any smokeless tobacco history on file. She reports that she does not drink alcohol. Her drug history is not on file.  Allergies:  Allergies  Allergen Reactions  . Phenergan [Promethazine Hcl]     Nausea Pt doesn't think she is allergic to this med  . Sulfa Antibiotics Hives    No prescriptions prior to admission.    ROS  AF, VSS Physical Exam  Gen - NAD Abd - soft, NT CV - RRR Lungs - clear Ext - NT, no edema  Assessment/Plan:  Menorrhagia, dysmenorrhea LAVH/BS R/b/a discussed including risk of laparotomy incision, bleeding, infection and damage to surrounding organs Questions answered, informed consent  Abdoulie Tierce 12/09/2016, 8:46 AM

## 2016-12-21 ENCOUNTER — Encounter (HOSPITAL_COMMUNITY): Payer: Self-pay

## 2016-12-21 ENCOUNTER — Encounter (HOSPITAL_COMMUNITY)
Admission: RE | Admit: 2016-12-21 | Discharge: 2016-12-21 | Disposition: A | Discharge status: Home / Self Care | Payer: 59 | Source: Ambulatory Visit | Attending: Obstetrics and Gynecology | Admitting: Obstetrics and Gynecology

## 2016-12-21 DIAGNOSIS — Z01812 Encounter for preprocedural laboratory examination: Secondary | ICD-10-CM | POA: Diagnosis present

## 2016-12-21 HISTORY — DX: Other specified postprocedural states: Z98.890

## 2016-12-21 HISTORY — DX: Personal history of urinary calculi: Z87.442

## 2016-12-21 HISTORY — DX: Abnormal levels of other serum enzymes: R74.8

## 2016-12-21 HISTORY — DX: Hypothyroidism, unspecified: E03.9

## 2016-12-21 HISTORY — DX: Raynaud's syndrome without gangrene: I73.00

## 2016-12-21 HISTORY — DX: Anemia, unspecified: D64.9

## 2016-12-21 HISTORY — DX: Nausea with vomiting, unspecified: R11.2

## 2016-12-21 HISTORY — DX: Infectious mononucleosis, unspecified without complication: B27.90

## 2016-12-21 LAB — BASIC METABOLIC PANEL
ANION GAP: 7 (ref 5–15)
BUN: 8 mg/dL (ref 6–20)
CALCIUM: 10.2 mg/dL (ref 8.9–10.3)
CHLORIDE: 101 mmol/L (ref 101–111)
CO2: 28 mmol/L (ref 22–32)
CREATININE: 1.08 mg/dL — AB (ref 0.44–1.00)
GFR calc Af Amer: >60 mL/min (ref >60)
GFR calc non Af Amer: >60 mL/min (ref >60)
GLUCOSE: 91 mg/dL (ref 65–99)
Potassium: 4.6 mmol/L (ref 3.5–5.1)
Sodium: 136 mmol/L (ref 135–145)

## 2016-12-21 LAB — CBC
HCT: 41.6 % (ref 36.0–46.0)
Hemoglobin: 14.3 g/dL (ref 12.0–15.0)
MCH: 30.7 pg (ref 26.0–34.0)
MCHC: 34.4 g/dL (ref 30.0–36.0)
MCV: 89.3 fL (ref 78.0–100.0)
PLATELETS: 209 10*3/uL (ref 150–400)
RBC: 4.66 MIL/uL (ref 3.87–5.11)
RDW: 12.8 % (ref 11.5–15.5)
WBC: 4.3 10*3/uL (ref 4.0–10.5)

## 2016-12-21 LAB — TYPE AND SCREEN
ABO/RH(D): O NEG
Antibody Screen: NEGATIVE

## 2016-12-21 LAB — ABO/RH: ABO/RH(D): O NEG

## 2016-12-21 NOTE — Patient Instructions (Addendum)
Your procedure is scheduled on:  Thursday, Aug. 16, 2018  [facility] at:  6:00 AM  Pick up the phone at the desk and dial [phone].  Call this number if you have problems the morning of surgery: [phone].  Remember: Do NOT eat food or drink after:  Midnight Wednesday  Take these medicines the morning of surgery with a SIP OF WATER:  Thyroid, Valtrex  Stop ALL herbal medications at this time  Do NOT smoke the day of surgery.  Do NOT wear jewelry (body piercing), metal hair clips/bobby pins, make-up, artifical eyelashes or nail polish. Do NOT wear lotions, powders, or perfumes.  You may wear deodorant. Do NOT shave for 48 hours prior to surgery. Do NOT bring valuables to the hospital. Contacts, dentures, or bridgework may not be worn into surgery.  Leave suitcase in car.  After surgery it may be brought to your room.  For patients admitted to the hospital, checkout time is 11:00 AM the day of discharge.  Bring a copy of your healthcare power of attorney and living will documents.

## 2016-12-31 ENCOUNTER — Encounter (HOSPITAL_COMMUNITY): Payer: Self-pay | Admitting: Anesthesiology

## 2016-12-31 ENCOUNTER — Encounter (HOSPITAL_COMMUNITY)
Admission: AD | Disposition: A | Discharge status: Home / Self Care | Payer: Self-pay | Source: Ambulatory Visit | Attending: Obstetrics and Gynecology

## 2016-12-31 ENCOUNTER — Ambulatory Visit (HOSPITAL_COMMUNITY): Payer: 59 | Admitting: Anesthesiology

## 2016-12-31 ENCOUNTER — Observation Stay (HOSPITAL_COMMUNITY)
Admission: AD | Admit: 2016-12-31 | Discharge: 2017-01-01 | Disposition: A | Discharge status: Home / Self Care | Payer: 59 | Source: Ambulatory Visit | Attending: Obstetrics and Gynecology | Admitting: Obstetrics and Gynecology

## 2016-12-31 DIAGNOSIS — Z888 Allergy status to other drugs, medicaments and biological substances status: Secondary | ICD-10-CM | POA: Diagnosis not present

## 2016-12-31 DIAGNOSIS — Z8279 Family history of other congenital malformations, deformations and chromosomal abnormalities: Secondary | ICD-10-CM | POA: Diagnosis not present

## 2016-12-31 DIAGNOSIS — N72 Inflammatory disease of cervix uteri: Secondary | ICD-10-CM | POA: Diagnosis not present

## 2016-12-31 DIAGNOSIS — Z9851 Tubal ligation status: Secondary | ICD-10-CM | POA: Insufficient documentation

## 2016-12-31 DIAGNOSIS — N946 Dysmenorrhea, unspecified: Secondary | ICD-10-CM | POA: Insufficient documentation

## 2016-12-31 DIAGNOSIS — N920 Excessive and frequent menstruation with regular cycle: Secondary | ICD-10-CM | POA: Diagnosis present

## 2016-12-31 DIAGNOSIS — N838 Other noninflammatory disorders of ovary, fallopian tube and broad ligament: Principal | ICD-10-CM | POA: Insufficient documentation

## 2016-12-31 DIAGNOSIS — Z9889 Other specified postprocedural states: Secondary | ICD-10-CM | POA: Diagnosis not present

## 2016-12-31 DIAGNOSIS — Z8249 Family history of ischemic heart disease and other diseases of the circulatory system: Secondary | ICD-10-CM | POA: Insufficient documentation

## 2016-12-31 DIAGNOSIS — Z882 Allergy status to sulfonamides status: Secondary | ICD-10-CM | POA: Diagnosis not present

## 2016-12-31 HISTORY — PX: LAPAROSCOPIC VAGINAL HYSTERECTOMY WITH SALPINGECTOMY: SHX6680

## 2016-12-31 LAB — PREGNANCY, URINE: PREG TEST UR: NEGATIVE

## 2016-12-31 SURGERY — HYSTERECTOMY, VAGINAL, LAPAROSCOPY-ASSISTED, WITH SALPINGECTOMY
Anesthesia: General | Laterality: Bilateral

## 2016-12-31 MED ORDER — MIDAZOLAM HCL 2 MG/2ML IJ SOLN
INTRAMUSCULAR | Status: AC
  Filled 2016-12-31: qty 2

## 2016-12-31 MED ORDER — HYDROMORPHONE HCL 1 MG/ML IJ SOLN
INTRAMUSCULAR | Status: AC
  Filled 2016-12-31: qty 0.5

## 2016-12-31 MED ORDER — HYDROMORPHONE HCL 1 MG/ML IJ SOLN
0.2500 mg | INTRAMUSCULAR | Status: DC | PRN
  Administered 2016-12-31 (×3): 0.5 mg via INTRAVENOUS

## 2016-12-31 MED ORDER — ROCURONIUM BROMIDE 100 MG/10ML IV SOLN
INTRAVENOUS | Status: AC
  Filled 2016-12-31: qty 1

## 2016-12-31 MED ORDER — DEXTROSE IN LACTATED RINGERS 5 % IV SOLN
INTRAVENOUS | Status: DC
  Administered 2016-12-31 – 2017-01-01 (×2): via INTRAVENOUS

## 2016-12-31 MED ORDER — MENTHOL 3 MG MT LOZG: 1.0000 | LOZENGE | OROMUCOSAL | Status: DC | PRN

## 2016-12-31 MED ORDER — ONDANSETRON HCL 4 MG PO TABS
4.0000 mg | ORAL_TABLET | Freq: Four times a day (QID) | ORAL | Status: DC | PRN
  Administered 2016-12-31 – 2017-01-01 (×2): 4 mg via ORAL
  Filled 2016-12-31 (×2): qty 1

## 2016-12-31 MED ORDER — DEXAMETHASONE SODIUM PHOSPHATE 10 MG/ML IJ SOLN
INTRAMUSCULAR | Status: AC
  Filled 2016-12-31: qty 1

## 2016-12-31 MED ORDER — ONDANSETRON HCL 4 MG/2ML IJ SOLN
INTRAMUSCULAR | Status: DC | PRN
  Administered 2016-12-31: 4 mg via INTRAVENOUS

## 2016-12-31 MED ORDER — ONDANSETRON HCL 4 MG/2ML IJ SOLN
INTRAMUSCULAR | Status: AC
  Filled 2016-12-31: qty 2

## 2016-12-31 MED ORDER — SCOPOLAMINE 1 MG/3DAYS TD PT72
MEDICATED_PATCH | TRANSDERMAL | Status: AC
  Administered 2016-12-31: 1.5 mg via TRANSDERMAL
  Filled 2016-12-31: qty 1

## 2016-12-31 MED ORDER — MIDAZOLAM HCL 2 MG/2ML IJ SOLN
INTRAMUSCULAR | Status: DC | PRN
Start: 2016-12-31 — End: 2016-12-31
  Administered 2016-12-31: 2 mg via INTRAVENOUS

## 2016-12-31 MED ORDER — SCOPOLAMINE 1 MG/3DAYS TD PT72
1.0000 | MEDICATED_PATCH | Freq: Once | TRANSDERMAL | Status: DC
  Administered 2016-12-31: 1.5 mg via TRANSDERMAL

## 2016-12-31 MED ORDER — CEFOTETAN DISODIUM-DEXTROSE 2-2.08 GM-% IV SOLR
2.0000 g | INTRAVENOUS | Status: AC
  Administered 2016-12-31: 2 g via INTRAVENOUS

## 2016-12-31 MED ORDER — DEXAMETHASONE SODIUM PHOSPHATE 10 MG/ML IJ SOLN
INTRAMUSCULAR | Status: DC | PRN
  Administered 2016-12-31: 10 mg via INTRAVENOUS

## 2016-12-31 MED ORDER — ACETAMINOPHEN 10 MG/ML IV SOLN
INTRAVENOUS | Status: DC | PRN
  Administered 2016-12-31: 1000 mg via INTRAVENOUS

## 2016-12-31 MED ORDER — PROMETHAZINE HCL 25 MG/ML IJ SOLN: 6.2500 mg | INTRAMUSCULAR | Status: DC | PRN

## 2016-12-31 MED ORDER — LACTATED RINGERS IV SOLN
INTRAVENOUS | Status: DC
  Administered 2016-12-31 (×3): via INTRAVENOUS

## 2016-12-31 MED ORDER — BUPIVACAINE HCL (PF) 0.25 % IJ SOLN
INTRAMUSCULAR | Status: AC
  Filled 2016-12-31: qty 30

## 2016-12-31 MED ORDER — PROPOFOL 10 MG/ML IV BOLUS
INTRAVENOUS | Status: AC
  Filled 2016-12-31: qty 20

## 2016-12-31 MED ORDER — BUPIVACAINE HCL (PF) 0.25 % IJ SOLN
INTRAMUSCULAR | Status: DC | PRN
  Administered 2016-12-31: 3 mL

## 2016-12-31 MED ORDER — HYDROMORPHONE HCL 1 MG/ML IJ SOLN
INTRAMUSCULAR | Status: AC
  Administered 2016-12-31: 0.5 mg via INTRAVENOUS
  Filled 2016-12-31: qty 0.5

## 2016-12-31 MED ORDER — MIDAZOLAM HCL 2 MG/2ML IJ SOLN: 0.5000 mg | Freq: Once | INTRAMUSCULAR | Status: DC | PRN

## 2016-12-31 MED ORDER — 0.9 % SODIUM CHLORIDE (POUR BTL) OPTIME
TOPICAL | Status: DC | PRN
  Administered 2016-12-31: 1000 mL

## 2016-12-31 MED ORDER — CEFOTETAN DISODIUM-DEXTROSE 2-2.08 GM-% IV SOLR
INTRAVENOUS | Status: AC
  Filled 2016-12-31: qty 50

## 2016-12-31 MED ORDER — SUGAMMADEX SODIUM 200 MG/2ML IV SOLN
INTRAVENOUS | Status: DC | PRN
  Administered 2016-12-31: 140 mg via INTRAVENOUS

## 2016-12-31 MED ORDER — OXYCODONE-ACETAMINOPHEN 5-325 MG PO TABS
1.0000 | ORAL_TABLET | ORAL | Status: DC | PRN
  Administered 2017-01-01: 1 via ORAL
  Filled 2016-12-31: qty 1

## 2016-12-31 MED ORDER — FENTANYL CITRATE (PF) 250 MCG/5ML IJ SOLN
INTRAMUSCULAR | Status: AC
  Filled 2016-12-31: qty 5

## 2016-12-31 MED ORDER — LIDOCAINE HCL (CARDIAC) 20 MG/ML IV SOLN
INTRAVENOUS | Status: AC
  Filled 2016-12-31: qty 5

## 2016-12-31 MED ORDER — ROCURONIUM BROMIDE 100 MG/10ML IV SOLN
INTRAVENOUS | Status: DC | PRN
  Administered 2016-12-31: 50 mg via INTRAVENOUS

## 2016-12-31 MED ORDER — HYDROMORPHONE HCL 1 MG/ML IJ SOLN
1.0000 mg | INTRAMUSCULAR | Status: DC | PRN
Start: 2016-12-31 — End: 2017-01-01
  Administered 2016-12-31 (×2): 1 mg via INTRAVENOUS
  Filled 2016-12-31 (×2): qty 1

## 2016-12-31 MED ORDER — KETOROLAC TROMETHAMINE 30 MG/ML IJ SOLN
INTRAMUSCULAR | Status: AC
  Filled 2016-12-31: qty 1

## 2016-12-31 MED ORDER — KETOROLAC TROMETHAMINE 30 MG/ML IJ SOLN
INTRAMUSCULAR | Status: DC | PRN
  Administered 2016-12-31: 15 mg via INTRAVENOUS

## 2016-12-31 MED ORDER — FENTANYL CITRATE (PF) 100 MCG/2ML IJ SOLN
INTRAMUSCULAR | Status: DC | PRN
Start: 2016-12-31 — End: 2016-12-31
  Administered 2016-12-31: 100 ug via INTRAVENOUS
  Administered 2016-12-31: 50 ug via INTRAVENOUS

## 2016-12-31 MED ORDER — MEPERIDINE HCL 25 MG/ML IJ SOLN: 6.2500 mg | INTRAMUSCULAR | Status: DC | PRN

## 2016-12-31 MED ORDER — PROPOFOL 10 MG/ML IV BOLUS
INTRAVENOUS | Status: DC | PRN
  Administered 2016-12-31: 200 mg via INTRAVENOUS

## 2016-12-31 MED ORDER — ONDANSETRON HCL 4 MG/2ML IJ SOLN
4.0000 mg | Freq: Four times a day (QID) | INTRAMUSCULAR | Status: DC | PRN
  Administered 2016-12-31: 4 mg via INTRAVENOUS
  Filled 2016-12-31: qty 2

## 2016-12-31 MED ORDER — ACETAMINOPHEN 10 MG/ML IV SOLN
INTRAVENOUS | Status: AC
  Filled 2016-12-31: qty 100

## 2016-12-31 MED ORDER — GLYCOPYRROLATE 0.2 MG/ML IJ SOLN
INTRAMUSCULAR | Status: DC | PRN
  Administered 2016-12-31: 0.1 mg via INTRAVENOUS

## 2016-12-31 MED ORDER — SUGAMMADEX SODIUM 200 MG/2ML IV SOLN
INTRAVENOUS | Status: AC
  Filled 2016-12-31: qty 2

## 2016-12-31 MED ORDER — LIDOCAINE HCL (CARDIAC) 20 MG/ML IV SOLN
INTRAVENOUS | Status: DC | PRN
Start: 2016-12-31 — End: 2016-12-31
  Administered 2016-12-31: 40 mg via INTRAVENOUS

## 2016-12-31 SURGICAL SUPPLY — 44 items
CABLE HIGH FREQUENCY MONO STRZ (ELECTRODE) IMPLANT
CANISTER SUCT 3000ML PPV (MISCELLANEOUS) ×3 IMPLANT
CLOTH BEACON ORANGE TIMEOUT ST (SAFETY) ×3 IMPLANT
COVER BACK TABLE 60X90IN (DRAPES) ×3 IMPLANT
DECANTER SPIKE VIAL GLASS SM (MISCELLANEOUS) IMPLANT
DERMABOND ADVANCED (GAUZE/BANDAGES/DRESSINGS) ×4
DERMABOND ADVANCED .7 DNX12 (GAUZE/BANDAGES/DRESSINGS) ×2 IMPLANT
DRSG OPSITE POSTOP 3X4 (GAUZE/BANDAGES/DRESSINGS) ×3 IMPLANT
DURAPREP 26ML APPLICATOR (WOUND CARE) ×3 IMPLANT
ELECT REM PT RETURN 9FT ADLT (ELECTROSURGICAL)
ELECTRODE REM PT RTRN 9FT ADLT (ELECTROSURGICAL) IMPLANT
GLOVE BIO SURGEON STRL SZ 6.5 (GLOVE) ×4 IMPLANT
GLOVE BIO SURGEONS STRL SZ 6.5 (GLOVE) ×2
GLOVE BIOGEL PI IND STRL 6.5 (GLOVE) ×1 IMPLANT
GLOVE BIOGEL PI IND STRL 7.0 (GLOVE) ×5 IMPLANT
GLOVE BIOGEL PI INDICATOR 6.5 (GLOVE) ×2
GLOVE BIOGEL PI INDICATOR 7.0 (GLOVE) ×10
LEGGING LITHOTOMY PAIR STRL (DRAPES) ×3 IMPLANT
NS IRRIG 1000ML POUR BTL (IV SOLUTION) ×3 IMPLANT
PACK LAVH (CUSTOM PROCEDURE TRAY) ×3 IMPLANT
PACK ROBOTIC GOWN (GOWN DISPOSABLE) ×3 IMPLANT
PACK TRENDGUARD 450 HYBRID PRO (MISCELLANEOUS) IMPLANT
PACK TRENDGUARD 600 HYBRD PROC (MISCELLANEOUS) IMPLANT
PROTECTOR NERVE ULNAR (MISCELLANEOUS) ×6 IMPLANT
SEALER TISSUE G2 CVD JAW 45CM (ENDOMECHANICALS) ×3 IMPLANT
SET IRRIG TUBING LAPAROSCOPIC (IRRIGATION / IRRIGATOR) IMPLANT
SLEEVE XCEL OPT CAN 5 100 (ENDOMECHANICALS) ×3 IMPLANT
SUT MNCRL 0 MO-4 VIOLET 18 CR (SUTURE) ×3 IMPLANT
SUT MNCRL 0 VIOLET 6X18 (SUTURE) ×1 IMPLANT
SUT MON AB 2-0 CT1 36 (SUTURE) ×3 IMPLANT
SUT MON AB 3-0 SH 27 (SUTURE)
SUT MON AB 3-0 SH27 (SUTURE) IMPLANT
SUT MONOCRYL 0 6X18 (SUTURE) ×2
SUT MONOCRYL 0 MO 4 18  CR/8 (SUTURE) ×6
SUT VIC AB 3-0 PS2 18 (SUTURE) ×2
SUT VIC AB 3-0 PS2 18XBRD (SUTURE) ×1 IMPLANT
SUT VICRYL 0 UR6 27IN ABS (SUTURE) ×3 IMPLANT
TOWEL OR 17X24 6PK STRL BLUE (TOWEL DISPOSABLE) ×6 IMPLANT
TRAY FOLEY CATH SILVER 14FR (SET/KITS/TRAYS/PACK) ×3 IMPLANT
TRENDGUARD 450 HYBRID PRO PACK (MISCELLANEOUS)
TRENDGUARD 600 HYBRID PROC PK (MISCELLANEOUS)
TROCAR OPTI TIP 5M 100M (ENDOMECHANICALS) ×3 IMPLANT
TROCAR XCEL NON-BLD 11X100MML (ENDOMECHANICALS) ×3 IMPLANT
WARMER LAPAROSCOPE (MISCELLANEOUS) ×3 IMPLANT

## 2016-12-31 NOTE — Anesthesia Procedure Notes (Signed)
Procedure Name: Intubation Date/Time: 12/31/2016 7:32 AM Performed by: Georgeanne Nim Pre-anesthesia Checklist: Patient identified, Emergency Drugs available, Suction available, Patient being monitored and Timeout performed Patient Re-evaluated:Patient Re-evaluated prior to induction Oxygen Delivery Method: Circle system utilized Preoxygenation: Pre-oxygenation with 100% oxygen Induction Type: IV induction Ventilation: Mask ventilation without difficulty Laryngoscope Size: Mac and 3 Grade View: Grade I Tube size: 7.0 mm Number of attempts: 1 Airway Equipment and Method: Stylet Placement Confirmation: ETT inserted through vocal cords under direct vision,  positive ETCO2,  CO2 detector and breath sounds checked- equal and bilateral Secured at: 21 cm Tube secured with: Tape Dental Injury: Teeth and Oropharynx as per pre-operative assessment  Comments: Intubated by Ivor Messier under supervision

## 2016-12-31 NOTE — Anesthesia Preprocedure Evaluation (Signed)
Anesthesia Evaluation  Patient identified by MRN, date of birth, ID band Patient awake    Reviewed: Allergy & Precautions, NPO status , Patient's Chart, lab work & pertinent test results  History of Anesthesia Complications (+) PONV  Airway Mallampati: I  TM Distance: >3 FB Neck ROM: Full    Dental  (+) Dental Advisory Given, Teeth Intact   Pulmonary neg pulmonary ROS,    breath sounds clear to auscultation       Cardiovascular negative cardio ROS   Rhythm:Regular Rate:Normal     Neuro/Psych negative neurological ROS     GI/Hepatic Neg liver ROS, GERD  Controlled,  Endo/Other  Hypothyroidism   Renal/GU negative Renal ROS     Musculoskeletal   Abdominal   Peds  Hematology negative hematology ROS (+)   Anesthesia Other Findings   Reproductive/Obstetrics                             Anesthesia Physical Anesthesia Plan  ASA: II  Anesthesia Plan: General   Post-op Pain Management:    Induction: Intravenous  PONV Risk Score and Plan: 4 or greater and Ondansetron, Dexamethasone, Midazolam and Scopolamine patch - Pre-op  Airway Management Planned: Oral ETT  Additional Equipment:   Intra-op Plan:   Post-operative Plan: Extubation in OR  Informed Consent: I have reviewed the patients History and Physical, chart, labs and discussed the procedure including the risks, benefits and alternatives for the proposed anesthesia with the patient or authorized representative who has indicated his/her understanding and acceptance.   Dental advisory given  Plan Discussed with: CRNA and Surgeon  Anesthesia Plan Comments: (Plan routine monitors, GETA)        Anesthesia Quick Evaluation

## 2016-12-31 NOTE — Transfer of Care (Signed)
Immediate Anesthesia Transfer of Care Note  Patient: Alexandra Strickland  Procedure(s) Performed: Procedure(s): LAPAROSCOPIC ASSISTED VAGINAL HYSTERECTOMY WITH SALPINGECTOMY (Bilateral)  Patient Location: PACU  Anesthesia Type:General  Level of Consciousness: awake, alert , oriented and patient cooperative  Airway & Oxygen Therapy: Patient Spontanous Breathing and Patient connected to nasal cannula oxygen  Post-op Assessment: Report given to RN and Post -op Vital signs reviewed and stable  Post vital signs: Reviewed and stable  Last Vitals:  Vitals:   12/31/16 0643  BP: 106/73  Pulse: 73  Resp: 16  Temp: 36.9 C  SpO2: 100%    Last Pain:  Vitals:   12/31/16 0643  TempSrc: Oral      Patients Stated Pain Goal: 3 (12/31/16 [id number])  Complications: No apparent anesthesia complications

## 2016-12-31 NOTE — Op Note (Signed)
NAMEMarland Kitchen  REGINA, GANCI NO.:  [id number]  MEDICAL RECORD NO.:  [id number]  LOCATION:                                 FACILITY:  PHYSICIAN:  Zelphia Cairo, MD         DATE OF BIRTH:  DATE OF PROCEDURE:  12/31/2016 DATE OF DISCHARGE:  01/01/2017                              OPERATIVE REPORT   PREOPERATIVE DIAGNOSIS:  Dysmenorrhea and menorrhagia.  POSTOPERATIVE DIAGNOSIS:  Dysmenorrhea and menorrhagia.  PROCEDURE: 1. Laparoscopic-assisted vaginal hysterectomy. 2. Bilateral salpingectomy. 3. Excision and removal of Filshie clips.  SURGEON:  Zelphia Cairo, MD.  ASSISTANT:  Juluis Mire, M.D.  ANESTHESIA:  General.  BLOOD LOSS:  150 mL.  URINE OUTPUT:  Clear.  DESCRIPTION OF PROCEDURE:  The patient was taken to the operating room after informed consent was obtained.  She was given anesthesia, placed in the dorsal lithotomy position using Allen stirrups.  She was prepped and draped in sterile fashion and a Foley catheter was inserted. Bivalve speculum was placed in the vagina and Hulka clamp was placed. The speculum was removed and our attention was turned to the abdomen. Lidocaine 1% was used to provide local anesthesia to the site of our incisions.  Infraumbilical skin incision was made with a scalpel and extended bluntly to the level of the fascia using a Kelly clamp. Optical trocar was then inserted under direct visualization.  Once intraperitoneal placement was confirmed, CO2 was turned on and a survey was performed.  Filshie clips were noted to be embedded in the omentum. Uterus, fallopian tubes, and ovaries appeared normal.  Right upper quadrant appeared normal.  Appendix was not visualized.  A 5 mm trocar was then inserted in the suprapubic region 2 cm above the pubic symphysis under direct visualization.  Atraumatic grasper was used to grasp the Filshie clip and tented upwards.  It was dissected free from the omentum using the EnSeal device.   This was repeated with a second Filshie clip and they were both removed through our trocar ports.  The patient was then placed in Trendelenburg position and the uterus was manipulated to the patient's left side.  The right fallopian tube was excised using the EnSeal and removed through the optical port passed off to be sent to pathology.  The right utero-ovarian ligament was then grasped, cauterized, and cut using the EnSeal device.  The round ligament was grasped, cauterized, and cut using the EnSeal.  This procedure was repeated on the left side.  The left fallopian tube was also grasped with an atraumatic grasper and tented upwards, it was excised using the EnSeal and removed through the port.  Our attention was then turned to the vagina.  Hulka clamp was removed and a weighted speculum was placed in the posterior cul-de-sac, a Deaver was placed anterior, and a circumferential incision was made using the Bovie.  The posterior cul-de- sac was then entered sharply with curved Mayo scissors.  A long weighted speculum was placed in the posterior cul-de-sac.  Bilateral uterosacral ligaments were clamped, cut, and suture ligated.  The anterior cul-de- sac was entered sharply and a Deaver was placed anteriorly.  Bilateral cardinal and uterine ligaments  were clamped, cut, and suture ligated. Thyroid tenaculum was then used to grasp the fundus of the uterus and deliver it through the posterior cul-de-sac.  Remaining pedicles were clamped and cut.  The uterus was passed off to be sent to pathology. These pedicles were suture ligated.  The posterior vaginal cuff was reapproximated to the peritoneum using a running lock stitch and then, the vaginal cuff was reapproximated using a series of Monocryl figure-of- eight sutures.  Excellent hemostasis was noted.  All instruments were removed from the vagina and our attention was returned to the abdomen.  The abdomen was insufflated.  All pedicles  were inspected and found to be hemostatic.  All instruments and trocars were removed from the abdomen.  A deep stitch was placed in the infraumbilical incision and the skin was closed with Vicryl.  Dermabond was placed.  The patient was extubated and taken to the recovery room in stable condition.  Sponge, lap, needle, and instrument counts were correct x2.     Zelphia CairoGretchen Raneisha Bress, MD     GA/MEDQ  D:  12/31/2016  T:  12/31/2016  Job:  [id number]

## 2016-12-31 NOTE — Anesthesia Postprocedure Evaluation (Signed)
Anesthesia Post Note  Patient: Harrie ForemanKristen L Weinand  Procedure(s) Performed: Procedure(s) (LRB): LAPAROSCOPIC ASSISTED VAGINAL HYSTERECTOMY WITH SALPINGECTOMY (Bilateral)     Patient location during evaluation: [facility] Anesthesia Type: General Level of consciousness: awake, awake and alert, oriented and patient cooperative Pain management: pain level controlled Vital Signs Assessment: post-procedure vital signs reviewed and stable Respiratory status: spontaneous breathing, nonlabored ventilation, respiratory function stable and patient connected to nasal cannula oxygen Cardiovascular status: stable Postop Assessment: no signs of nausea or vomiting Anesthetic complications: no    Last Vitals:  Vitals:   12/31/16 1200 12/31/16 1315  BP: (!) 98/56 (!) 99/54  Pulse: (!) 56 (!) 59  Resp: 12 14  Temp:  36.8 C  SpO2: 100% 100%    Last Pain:  Vitals:   12/31/16 1315  TempSrc: Oral  PainSc:    Pain Goal: Patients Stated Pain Goal: 3 (12/31/16 0643)               Levern Pitter L

## 2016-12-31 NOTE — Progress Notes (Signed)
Day of Surgery Procedure(s) (LRB): LAPAROSCOPIC ASSISTED VAGINAL HYSTERECTOMY WITH SALPINGECTOMY (Bilateral)  Subjective: Patient reports incisional pain controlled with IV meds.  No CP or SOB.    Objective: I have reviewed patient's vital signs, intake and output, medications and labs. UOP 30cc/hr  General: alert and cooperative GI: normal findings: soft, non-tender Extremities: extremities normal, atraumatic, no cyanosis or edema  Assessment: s/p Procedure(s): LAPAROSCOPIC ASSISTED VAGINAL HYSTERECTOMY WITH SALPINGECTOMY (Bilateral): stable and progressing well  Plan: Advance diet Encourage ambulation  Recheck Cr in morning  LOS: 0 days    Alexandra Strickland 12/31/2016, 4:59 PM

## 2017-01-01 ENCOUNTER — Encounter (HOSPITAL_COMMUNITY): Payer: Self-pay | Admitting: Obstetrics and Gynecology

## 2017-01-01 DIAGNOSIS — N838 Other noninflammatory disorders of ovary, fallopian tube and broad ligament: Secondary | ICD-10-CM | POA: Diagnosis not present

## 2017-01-01 LAB — CBC
HCT: 32.9 % — ABNORMAL LOW (ref 36.0–46.0)
HEMOGLOBIN: 11.4 g/dL — AB (ref 12.0–15.0)
MCH: 30.7 pg (ref 26.0–34.0)
MCHC: 34.7 g/dL (ref 30.0–36.0)
MCV: 88.7 fL (ref 78.0–100.0)
Platelets: 152 10*3/uL (ref 150–400)
RBC: 3.71 MIL/uL — ABNORMAL LOW (ref 3.87–5.11)
RDW: 13.1 % (ref 11.5–15.5)
WBC: 8.3 10*3/uL (ref 4.0–10.5)

## 2017-01-01 LAB — COMPREHENSIVE METABOLIC PANEL
ALBUMIN: 3.1 g/dL — AB (ref 3.5–5.0)
ALT: 21 U/L (ref 14–54)
ANION GAP: 5 (ref 5–15)
AST: 20 U/L (ref 15–41)
Alkaline Phosphatase: 33 U/L — ABNORMAL LOW (ref 38–126)
BUN: 7 mg/dL (ref 6–20)
CO2: 27 mmol/L (ref 22–32)
CREATININE: 0.76 mg/dL (ref 0.44–1.00)
Calcium: 8.8 mg/dL — ABNORMAL LOW (ref 8.9–10.3)
Chloride: 106 mmol/L (ref 101–111)
GFR calc non Af Amer: >60 mL/min (ref >60)
Glucose, Bld: 140 mg/dL — ABNORMAL HIGH (ref 65–99)
Potassium: 4.2 mmol/L (ref 3.5–5.1)
SODIUM: 138 mmol/L (ref 135–145)
TOTAL PROTEIN: 5.6 g/dL — AB (ref 6.5–8.1)
Total Bilirubin: 0.5 mg/dL (ref 0.3–1.2)

## 2017-01-01 MED ORDER — IBUPROFEN 600 MG PO TABS: 600.0000 mg | ORAL_TABLET | Freq: Four times a day (QID) | ORAL | 0 refills | Status: AC | PRN

## 2017-01-01 MED ORDER — ACETAMINOPHEN 325 MG PO TABS
650.0000 mg | ORAL_TABLET | Freq: Four times a day (QID) | ORAL | Status: DC | PRN
  Administered 2017-01-01: 650 mg via ORAL
  Filled 2017-01-01: qty 2

## 2017-01-01 MED ORDER — OXYCODONE-ACETAMINOPHEN 5-325 MG PO TABS: 1.0000 | ORAL_TABLET | ORAL | 0 refills | Status: AC | PRN

## 2017-01-01 MED ORDER — IBUPROFEN 600 MG PO TABS
600.0000 mg | ORAL_TABLET | Freq: Four times a day (QID) | ORAL | Status: DC | PRN
Start: 2017-01-01 — End: 2017-01-01
  Administered 2017-01-01: 600 mg via ORAL
  Filled 2017-01-01: qty 1

## 2017-01-01 NOTE — Progress Notes (Signed)
Pt discharged to home with husband.  Condition stable.  Pt ambulated to car with A. Cameron, NT.  No equipment for home ordered at discharge. 

## 2017-01-25 NOTE — Discharge Summary (Signed)
Physician Discharge Summary  Patient ID: Alexandra Strickland MRN: [id number] DOB/AGE: Jan 13, 1981 36 y.o.  Admit date: 12/31/2016 Discharge date: 01/25/2017  Admission Diagnoses:  Menorrhagia, dysmenorrhea  Discharge Diagnoses:  Active Problems:   Menorrhagia   Discharged Condition: good  Hospital Course: Pt was admitted for postop care.  Initially pain was controlled with IV medications.  Once she was able to tolerate PO - her diet was advanced and pain was controlled with oral medications.  She was ambulatory on POD1 and foley catheter was removed.  Vitals and labs were stable and she was ready for discharge on POD1.    Consults: None  Significant Diagnostic Studies: labs: cbc  Treatments: surgery: LAVH/BS  Discharge Exam: Blood pressure (!) 99/53, pulse 66, temperature 99.1 F (37.3 C), temperature source Oral, resp. rate 16, height 5\' 10"  (1.778 m), weight 160 lb (72.6 kg), last menstrual period 12/14/2016, SpO2 100 %. General appearance: alert and cooperative GI: normal findings: soft, non-tender Incision/Wound:c/d/i  Disposition: 01-Home or Self Care  Discharge Instructions    Call MD for:  difficulty breathing, headache or visual disturbances    Complete by:  As directed    Call MD for:  persistant nausea and vomiting    Complete by:  As directed    Call MD for:  redness, tenderness, or signs of infection (pain, swelling, redness, odor or green/yellow discharge around incision site)    Complete by:  As directed    Call MD for:  severe uncontrolled pain    Complete by:  As directed    Call MD for:  temperature >100.4    Complete by:  As directed    Diet - low sodium heart healthy    Complete by:  As directed    Increase activity slowly    Complete by:  As directed      Allergies as of 01/01/2017      Reactions   Codeine Nausea And Vomiting   Gluten Meal Nausea And Vomiting   GI Distress   Lactose Intolerance (gi) Nausea And Vomiting   GI Distress   Other Nausea  And Vomiting   All pain medications    Sulfa Antibiotics Hives      Medication List    STOP taking these medications   acetaminophen 500 MG tablet Commonly known as:  TYLENOL   progesterone 100 MG capsule Commonly known as:  PROMETRIUM     TAKE these medications   calcium carbonate 500 MG chewable tablet Commonly known as:  TUMS - dosed in mg elemental calcium Chew 1 tablet by mouth as needed for indigestion or heartburn.   Dapsone 5 % topical gel APPLY ON THE SKIN TWICE DAILY   fluconazole 100 MG tablet Commonly known as:  DIFLUCAN Take 100 mg by mouth daily. For 30 days started 12-11-16   ibuprofen 600 MG tablet Commonly known as:  ADVIL,MOTRIN Take 1 tablet (600 mg total) by mouth every 6 (six) hours as needed for moderate pain. What changed:  medication strength  reasons to take this   multivitamin tablet Take 1 tablet by mouth daily.   oxyCODONE-acetaminophen 5-325 MG tablet Commonly known as:  PERCOCET/ROXICET Take 1-2 tablets by mouth every 4 (four) hours as needed for severe pain (moderate to severe pain (when tolerating fluids)).   spironolactone 50 MG tablet Commonly known as:  ALDACTONE Take 50 mg by mouth daily.   thyroid 65 MG tablet Commonly known as:  ARMOUR Take 65 mg by mouth daily.   UNABLE  TO FIND High dose Vitamin-C and Zinc infusion at [facility],  Dr Pricilla Holm   valACYclovir 1000 MG tablet Commonly known as:  VALTREX Take 1,000 mg by mouth 3 (three) times daily. Started 12-17-16            Discharge Care Instructions        Start     Ordered   01/01/17 0000  ibuprofen (ADVIL,MOTRIN) 600 MG tablet  Every 6 hours PRN     01/01/17 0817   01/01/17 0000  oxyCODONE-acetaminophen (PERCOCET/ROXICET) 5-325 MG tablet  Every 4 hours PRN     01/01/17 0817   01/01/17 0000  Increase activity slowly     01/01/17 0817   01/01/17 0000  Diet - low sodium heart healthy     01/01/17 0817   01/01/17 0000  Call MD for:  temperature  >100.4     01/01/17 0817   01/01/17 0000  Call MD for:  persistant nausea and vomiting     01/01/17 0817   01/01/17 0000  Call MD for:  severe uncontrolled pain     01/01/17 0817   01/01/17 0000  Call MD for:  redness, tenderness, or signs of infection (pain, swelling, redness, odor or green/yellow discharge around incision site)     01/01/17 0817   01/01/17 0000  Call MD for:  difficulty breathing, headache or visual disturbances     01/01/17 0817       Signed: Tyniah Kastens 01/25/2017, 8:21 AM

## 2017-03-13 ENCOUNTER — Emergency Department (HOSPITAL_COMMUNITY)
Admission: EM | Admit: 2017-03-13 | Discharge: 2017-03-13 | Disposition: A | Discharge status: Home / Self Care | Payer: Managed Care, Other (non HMO) | Attending: Emergency Medicine | Admitting: Emergency Medicine

## 2017-03-13 ENCOUNTER — Encounter (HOSPITAL_COMMUNITY): Payer: Self-pay | Admitting: Emergency Medicine

## 2017-03-13 DIAGNOSIS — N390 Urinary tract infection, site not specified: Secondary | ICD-10-CM | POA: Insufficient documentation

## 2017-03-13 DIAGNOSIS — E039 Hypothyroidism, unspecified: Secondary | ICD-10-CM | POA: Diagnosis not present

## 2017-03-13 DIAGNOSIS — Z79899 Other long term (current) drug therapy: Secondary | ICD-10-CM | POA: Diagnosis not present

## 2017-03-13 DIAGNOSIS — R509 Fever, unspecified: Secondary | ICD-10-CM | POA: Diagnosis present

## 2017-03-13 DIAGNOSIS — R319 Hematuria, unspecified: Secondary | ICD-10-CM

## 2017-03-13 LAB — URINALYSIS, ROUTINE W REFLEX MICROSCOPIC
BILIRUBIN URINE: NEGATIVE
Glucose, UA: NEGATIVE mg/dL
KETONES UR: NEGATIVE mg/dL
Nitrite: NEGATIVE
Protein, ur: 100 mg/dL — AB
Specific Gravity, Urine: 1.005 (ref 1.005–1.030)
pH: 7 (ref 5.0–8.0)

## 2017-03-13 LAB — CBC
HEMATOCRIT: 39.3 % (ref 36.0–46.0)
Hemoglobin: 13.5 g/dL (ref 12.0–15.0)
MCH: 30.8 pg (ref 26.0–34.0)
MCHC: 34.4 g/dL (ref 30.0–36.0)
MCV: 89.7 fL (ref 78.0–100.0)
Platelets: 162 10*3/uL (ref 150–400)
RBC: 4.38 MIL/uL (ref 3.87–5.11)
RDW: 14.4 % (ref 11.5–15.5)
WBC: 12.5 10*3/uL — AB (ref 4.0–10.5)

## 2017-03-13 LAB — HEPATIC FUNCTION PANEL
ALBUMIN: 3.9 g/dL (ref 3.5–5.0)
ALT: 52 U/L (ref 14–54)
AST: 54 U/L — ABNORMAL HIGH (ref 15–41)
Alkaline Phosphatase: 57 U/L (ref 38–126)
Bilirubin, Direct: 0.1 mg/dL (ref 0.1–0.5)
Indirect Bilirubin: 0.9 mg/dL (ref 0.3–0.9)
TOTAL PROTEIN: 6.9 g/dL (ref 6.5–8.1)
Total Bilirubin: 1 mg/dL (ref 0.3–1.2)

## 2017-03-13 LAB — DIFFERENTIAL
BASOS ABS: 0 10*3/uL (ref 0.0–0.1)
BASOS PCT: 0 %
EOS ABS: 0 10*3/uL (ref 0.0–0.7)
Eosinophils Relative: 0 %
Lymphocytes Relative: 5 %
Lymphs Abs: 0.7 10*3/uL (ref 0.7–4.0)
Monocytes Absolute: 0.2 10*3/uL (ref 0.1–1.0)
Monocytes Relative: 2 %
NEUTROS PCT: 93 %
Neutro Abs: 12 10*3/uL — ABNORMAL HIGH (ref 1.7–7.7)

## 2017-03-13 LAB — BASIC METABOLIC PANEL
Anion gap: 9 (ref 5–15)
BUN: 8 mg/dL (ref 6–20)
CHLORIDE: 100 mmol/L — AB (ref 101–111)
CO2: 23 mmol/L (ref 22–32)
CREATININE: 1.09 mg/dL — AB (ref 0.44–1.00)
Calcium: 9.4 mg/dL (ref 8.9–10.3)
GFR calc Af Amer: >60 mL/min (ref >60)
GFR calc non Af Amer: >60 mL/min (ref >60)
Glucose, Bld: 154 mg/dL — ABNORMAL HIGH (ref 65–99)
POTASSIUM: 3.7 mmol/L (ref 3.5–5.1)
SODIUM: 132 mmol/L — AB (ref 135–145)

## 2017-03-13 LAB — CK: CK TOTAL: 69 U/L (ref 38–234)

## 2017-03-13 MED ORDER — DEXTROSE 5 % IV SOLN
1.0000 g | Freq: Once | INTRAVENOUS | Status: AC
  Administered 2017-03-13: 1 g via INTRAVENOUS
  Filled 2017-03-13: qty 10

## 2017-03-13 MED ORDER — KETOROLAC TROMETHAMINE 30 MG/ML IJ SOLN
30.0000 mg | Freq: Once | INTRAMUSCULAR | Status: AC
  Administered 2017-03-13: 30 mg via INTRAVENOUS
  Filled 2017-03-13: qty 1

## 2017-03-13 MED ORDER — PHENAZOPYRIDINE HCL 200 MG PO TABS: 200.0000 mg | ORAL_TABLET | Freq: Three times a day (TID) | ORAL | 0 refills | Status: AC

## 2017-03-13 MED ORDER — ONDANSETRON HCL 4 MG PO TABS: 4.0000 mg | ORAL_TABLET | Freq: Four times a day (QID) | ORAL | 0 refills | Status: AC | PRN

## 2017-03-13 MED ORDER — CEPHALEXIN 500 MG PO CAPS
500.0000 mg | ORAL_CAPSULE | Freq: Four times a day (QID) | ORAL | 0 refills | Status: AC
Start: 2017-03-13 — End: ?

## 2017-03-13 MED ORDER — SODIUM CHLORIDE 0.9 % IV BOLUS (SEPSIS)
1000.0000 mL | Freq: Once | INTRAVENOUS | Status: AC
  Administered 2017-03-13: 1000 mL via INTRAVENOUS

## 2017-03-13 NOTE — ED Notes (Signed)
Patient is in a gown on the monitor patient is resting with family at bedside and call bell in reach

## 2017-03-13 NOTE — ED Notes (Signed)
Teshia in lab to add on CK and hepatic function panel.

## 2017-03-13 NOTE — ED Triage Notes (Signed)
[facility] blood in urine and fever x 1 week. Pt reports that she takes valtrex and the doctor was concerned about her kidney function. Pt also c/o joint pain and body aches.

## 2017-03-13 NOTE — ED Provider Notes (Signed)
[facility] EMERGENCY DEPARTMENT Provider Note   CSN: [id number] Arrival date & time: 03/13/17  1001     History   Chief Complaint Chief Complaint  Patient presents with  . Fever  . Hematuria    HPI Alexandra Strickland is a 36 y.o. female.  The history is provided by the patient.  She was sent here from an urgent care center where urine suggested infection. She started having fever and chills last night. Temperature was over 103.0. She also had generalized body aches. She currently puts her pain at 8/10. She took acetaminophen for fever which did bring the temperature down but did not affect the body aches. She took a second dose of acetaminophen this morning which has improved her pain. She has noted some slight urinary frequency and some dysuria at the end of the stream. She denies urgency or tenesmus. She denies abdominal pain or flank pain. There has been some nausea but no vomiting. She states that an ultrasound had suggested some kidney stones. She also relates that she has T3 hypothyroidism and usually runs low temperatures because of that. She is status post hysterectomy 2 months ago. She also relates history of elevated liver enzymes and elevated CK and was supposed to have those checked, but had not gone back for testing. She is requesting that these be checked today.  Past Medical History:  Diagnosis Date  . Anemia    iron infusions  . Elevated liver enzymes   . Malachi Carl infection   . History of kidney stones    unobstructive kidney stone  . Hyperglycemia    Very borderline  . Hypothyroidism   . PONV (postoperative nausea and vomiting)   . Raynaud disease     Patient Active Problem List   Diagnosis Date Noted  . Menorrhagia 12/31/2016  . 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 Chronic fatigue 01/30/2016  . Hair loss 01/30/2016  . LFT elevation 08/20/2014  . Syncope and collapse 08/18/2014    Past Surgical History:  Procedure  Laterality Date  . ABDOMINAL HYSTERECTOMY    . LAPAROSCOPIC VAGINAL HYSTERECTOMY WITH SALPINGECTOMY Bilateral 12/31/2016   Procedure: LAPAROSCOPIC ASSISTED VAGINAL HYSTERECTOMY WITH SALPINGECTOMY;  Surgeon: Zelphia Cairo, MD;  Location: [facility];  Service: Gynecology;  Laterality: Bilateral;  . NO PAST SURGERIES    . TOE SURGERY    . TUBAL LIGATION      OB History    Gravida Para Term Preterm AB Living   3 3 3          SAB TAB Ectopic Multiple Live Births                   Home Medications    Prior to Admission medications   Medication Sig Start Date End Date Taking? Authorizing Provider  acetaminophen (TYLENOL) 500 MG tablet Take 500 mg by mouth every 6 (six) hours as needed for mild pain.   Yes [provider]  calcium carbonate (TUMS - DOSED IN MG ELEMENTAL CALCIUM) 500 MG chewable tablet Chew 1 tablet by mouth as needed for indigestion or heartburn.   Yes [provider]  Dapsone 5 % topical gel APPLY ON THE SKIN TWICE DAILY 10/16/16  Yes [provider]  ibuprofen (ADVIL,MOTRIN) 600 MG tablet Take 1 tablet (600 mg total) by mouth every 6 (six) hours as needed for moderate pain. 01/01/17  Yes Zelphia Cairo, MD  Multiple Vitamin (MULTIVITAMIN) tablet Take 1 tablet by mouth daily.   Yes  [provider]  spironolactone (ALDACTONE) 50 MG tablet Take 50 mg by mouth daily.    Yes [provider]  thyroid (ARMOUR) 65 MG tablet Take 65 mg by mouth daily.   Yes [provider]  valACYclovir (VALTREX) 1000 MG tablet Take 1,000 mg by mouth daily. Started 12-17-16    Yes [provider]  oxyCODONE-acetaminophen (PERCOCET/ROXICET) 5-325 MG tablet Take 1-2 tablets by mouth every 4 (four) hours as needed for severe pain (moderate to severe pain (when tolerating fluids)). Patient not taking: Reported on 03/13/2017 01/01/17   Zelphia Cairo, MD    Family History Family History  Problem Relation Age of Onset  . Cancer Father         Died age 91  . Marfan syndrome Brother        AVR  . Heart attack Paternal Grandmother   . Heart murmur Paternal Grandmother   . Heart attack Paternal Grandfather   . Marfan syndrome Brother     Social History Social History  Substance Use Topics  . Smoking status: Never Smoker  . Smokeless tobacco: Never Used  . Alcohol use No     Allergies   Codeine; Gluten meal; Lactose intolerance (gi); Other; and Sulfa antibiotics   Review of Systems Review of Systems  All other systems reviewed and are negative.    Physical Exam Updated Vital Signs BP 96/79 (BP Location: Left Arm)   Pulse 87   Temp 98.4 F (36.9 C) (Oral)   Resp 16   Ht 5\' 10"  (1.778 m)   Wt 72.6 kg (160 lb)   LMP 12/14/2016 (Exact Date)   SpO2 100%   BMI 22.96 kg/m   Physical Exam  Nursing note and vitals reviewed.  36 year old female, resting comfortably and in no acute distress. Vital signs are normal. Oxygen saturation is 100%, which is normal. Head is normocephalic and atraumatic. PERRLA, EOMI. Oropharynx is clear. Neck is nontender and supple without adenopathy or JVD. Back is nontender and there is no CVA tenderness. Lungs are clear without rales, wheezes, or rhonchi. Chest is nontender. Heart has regular rate and rhythm without murmur. Abdomen is soft, flat, nontender without masses or hepatosplenomegaly and peristalsis is normoactive. Extremities have no cyanosis or edema, full range of motion is present. Skin is warm and dry without rash. Neurologic: Mental status is normal, cranial nerves are intact, there are no motor or sensory deficits.  ED Treatments / Results  Labs (all labs ordered are listed, but only abnormal results are displayed) Labs Reviewed  BASIC METABOLIC PANEL - Abnormal; Notable for the following:       Result Value   Sodium 132 (*)    Chloride 100 (*)    Glucose, Bld 154 (*)    Creatinine, Ser 1.09 (*)    All other components within normal limits  CBC - Abnormal;  Notable for the following:    WBC 12.5 (*)    All other components within normal limits  URINALYSIS, ROUTINE W REFLEX MICROSCOPIC - Abnormal; Notable for the following:    APPearance HAZY (*)    Hgb urine dipstick LARGE (*)    Protein, ur 100 (*)    Leukocytes, UA LARGE (*)    Bacteria, UA FEW (*)    Squamous Epithelial / LPF 0-5 (*)    Non Squamous Epithelial 0-5 (*)    All other components within normal limits  DIFFERENTIAL - Abnormal; Notable for the following:    Neutro Abs 12.0 (*)  All other components within normal limits  HEPATIC FUNCTION PANEL - Abnormal; Notable for the following:    AST 54 (*)    All other components within normal limits  URINE CULTURE  CK   Procedures Procedures (including critical care time)  Medications Ordered in ED Medications  sodium chloride 0.9 % bolus 1,000 mL (not administered)  cefTRIAXone (ROCEPHIN) 1 g in dextrose 5 % 50 mL IVPB (not administered)  ketorolac (TORADOL) 30 MG/ML injection 30 mg (not administered)     Initial Impression / Assessment and Plan / ED Course  I have reviewed the triage vital signs and the nursing notes.  Pertinent lab results that were available during my care of the patient were reviewed by me and considered in my medical decision making (see chart for details).  Urinary tract infection with fever. I reviewed her records from the urgent care center, and urinalysis showed presence of ketones, protein, and nitrite. Urinalysis will need to be repeated here and will be sent for culture. She is afebrile here and appears nontoxic with normal vital signs. She'll be given IV fluids and initial dose of ceftriaxone as well as a dose of ketorolac. Per her request, will check CK and liver enzymes. I anticipate being able to send her home on oral antibiotics.   She is feeling significantly better following above-noted treatment. She is discharged with prescription for cephalexin and phenazopyridine. Also given a  prescription for ondansetron. Preterm percussions discussed. Follow-up with PCP in 2 weeks for repeat urinalysis.  Final Clinical Impressions(s) / ED Diagnoses   Final diagnoses:  Urinary tract infection with hematuria, site unspecified    New Prescriptions New Prescriptions   CEPHALEXIN (KEFLEX) 500 MG CAPSULE    Take 1 capsule (500 mg total) by mouth 4 (four) times daily.   ONDANSETRON (ZOFRAN) 4 MG TABLET    Take 1 tablet (4 mg total) by mouth every 6 (six) hours as needed for nausea or vomiting.   PHENAZOPYRIDINE (PYRIDIUM) 200 MG TABLET    Take 1 tablet (200 mg total) by mouth 3 (three) times daily.     Dione BoozeGlick, Mary-Ann Pennella, MD 03/13/17 [phone]

## 2017-03-13 NOTE — Discharge Instructions (Signed)
Drink plenty of fluids. Take acetaminophen, ibuprofen, or naproxen as needed for pain or fever. Return if symptoms are getting worse.

## 2017-03-14 LAB — URINE CULTURE
